# Patient Record
Sex: Male | Born: 1960 | Race: White | Hispanic: No | Marital: Married
Health system: Southern US, Community
[De-identification: ages and names within clinical notes are randomized; demographics above are authoritative.]

## PROBLEM LIST (undated history)

## (undated) DIAGNOSIS — I5032 Chronic diastolic (congestive) heart failure: Secondary | ICD-10-CM

## (undated) DIAGNOSIS — N184 Chronic kidney disease, stage 4 (severe): Secondary | ICD-10-CM

## (undated) DIAGNOSIS — I480 Paroxysmal atrial fibrillation: Secondary | ICD-10-CM

## (undated) DIAGNOSIS — J9621 Acute and chronic respiratory failure with hypoxia: Secondary | ICD-10-CM

## (undated) DIAGNOSIS — J449 Chronic obstructive pulmonary disease, unspecified: Secondary | ICD-10-CM

---

## 2020-04-04 ENCOUNTER — Other Ambulatory Visit (HOSPITAL_COMMUNITY): Payer: Medicare Other

## 2020-04-04 ENCOUNTER — Inpatient Hospital Stay
Admission: AD | Admit: 2020-04-04 | Discharge: 2020-05-17 | Disposition: A | Discharge status: Home Health | Payer: Medicare Other | Source: Other Acute Inpatient Hospital | Attending: Internal Medicine | Admitting: Internal Medicine

## 2020-04-04 DIAGNOSIS — K5649 Other impaction of intestine: Secondary | ICD-10-CM

## 2020-04-04 DIAGNOSIS — T85598A Other mechanical complication of other gastrointestinal prosthetic devices, implants and grafts, initial encounter: Secondary | ICD-10-CM

## 2020-04-04 DIAGNOSIS — J449 Chronic obstructive pulmonary disease, unspecified: Secondary | ICD-10-CM | POA: Diagnosis present

## 2020-04-04 DIAGNOSIS — J9690 Respiratory failure, unspecified, unspecified whether with hypoxia or hypercapnia: Secondary | ICD-10-CM

## 2020-04-04 DIAGNOSIS — J9621 Acute and chronic respiratory failure with hypoxia: Secondary | ICD-10-CM | POA: Diagnosis present

## 2020-04-04 DIAGNOSIS — N184 Chronic kidney disease, stage 4 (severe): Secondary | ICD-10-CM | POA: Diagnosis present

## 2020-04-04 DIAGNOSIS — J189 Pneumonia, unspecified organism: Secondary | ICD-10-CM

## 2020-04-04 DIAGNOSIS — I480 Paroxysmal atrial fibrillation: Secondary | ICD-10-CM | POA: Diagnosis present

## 2020-04-04 DIAGNOSIS — I5032 Chronic diastolic (congestive) heart failure: Secondary | ICD-10-CM | POA: Diagnosis present

## 2020-04-04 HISTORY — DX: Chronic diastolic (congestive) heart failure: I50.32

## 2020-04-04 HISTORY — DX: Acute and chronic respiratory failure with hypoxia: J96.21

## 2020-04-04 HISTORY — DX: Chronic obstructive pulmonary disease, unspecified: J44.9

## 2020-04-04 HISTORY — DX: Paroxysmal atrial fibrillation: I48.0

## 2020-04-04 HISTORY — DX: Chronic kidney disease, stage 4 (severe): N18.4

## 2020-04-04 LAB — CBC WITH DIFFERENTIAL/PLATELET
Abs Immature Granulocytes: 0.31 10*3/uL — ABNORMAL HIGH (ref 0.00–0.07)
Basophils Absolute: 0.2 10*3/uL — ABNORMAL HIGH (ref 0.0–0.1)
Basophils Relative: 1 %
Eosinophils Absolute: 0.5 10*3/uL (ref 0.0–0.5)
Eosinophils Relative: 3 %
HCT: 34.1 % — ABNORMAL LOW (ref 39.0–52.0)
Hemoglobin: 9.2 g/dL — ABNORMAL LOW (ref 13.0–17.0)
Immature Granulocytes: 2 %
Lymphocytes Relative: 9 %
Lymphs Abs: 1.8 10*3/uL (ref 0.7–4.0)
MCH: 30.4 pg (ref 26.0–34.0)
MCHC: 27 g/dL — ABNORMAL LOW (ref 30.0–36.0)
MCV: 112.5 fL — ABNORMAL HIGH (ref 80.0–100.0)
Monocytes Absolute: 1.4 10*3/uL — ABNORMAL HIGH (ref 0.1–1.0)
Monocytes Relative: 7 %
Neutro Abs: 15.6 10*3/uL — ABNORMAL HIGH (ref 1.7–7.7)
Neutrophils Relative %: 78 %
Platelets: 414 10*3/uL — ABNORMAL HIGH (ref 150–400)
RBC: 3.03 MIL/uL — ABNORMAL LOW (ref 4.22–5.81)
RDW: 13.7 % (ref 11.5–15.5)
WBC: 19.7 10*3/uL — ABNORMAL HIGH (ref 4.0–10.5)
nRBC: 0.1 % (ref 0.0–0.2)

## 2020-04-04 LAB — BLOOD GAS, ARTERIAL
Acid-base deficit: 0.2 mmol/L (ref 0.0–2.0)
Bicarbonate: 23.8 mmol/L (ref 20.0–28.0)
FIO2: 30
O2 Saturation: 97.6 %
Patient temperature: 36.7
pCO2 arterial: 37.9 mmHg (ref 32.0–48.0)
pH, Arterial: 7.413 (ref 7.350–7.450)
pO2, Arterial: 93.4 mmHg (ref 83.0–108.0)

## 2020-04-04 LAB — PROTIME-INR
INR: 1.1 (ref 0.8–1.2)
Prothrombin Time: 14 seconds (ref 11.4–15.2)

## 2020-04-04 LAB — APTT: aPTT: 30 seconds (ref 24–36)

## 2020-04-05 ENCOUNTER — Other Ambulatory Visit (HOSPITAL_COMMUNITY): Payer: Medicare Other

## 2020-04-05 DIAGNOSIS — N184 Chronic kidney disease, stage 4 (severe): Secondary | ICD-10-CM | POA: Diagnosis not present

## 2020-04-05 DIAGNOSIS — J449 Chronic obstructive pulmonary disease, unspecified: Secondary | ICD-10-CM

## 2020-04-05 DIAGNOSIS — J9621 Acute and chronic respiratory failure with hypoxia: Secondary | ICD-10-CM

## 2020-04-05 DIAGNOSIS — I480 Paroxysmal atrial fibrillation: Secondary | ICD-10-CM

## 2020-04-05 DIAGNOSIS — I5032 Chronic diastolic (congestive) heart failure: Secondary | ICD-10-CM | POA: Diagnosis not present

## 2020-04-05 LAB — CBC
HCT: 28 % — ABNORMAL LOW (ref 39.0–52.0)
Hemoglobin: 8.6 g/dL — ABNORMAL LOW (ref 13.0–17.0)
MCH: 30.5 pg (ref 26.0–34.0)
MCHC: 30.7 g/dL (ref 30.0–36.0)
MCV: 99.3 fL (ref 80.0–100.0)
Platelets: 411 10*3/uL — ABNORMAL HIGH (ref 150–400)
RBC: 2.82 MIL/uL — ABNORMAL LOW (ref 4.22–5.81)
RDW: 13.7 % (ref 11.5–15.5)
WBC: 19.2 10*3/uL — ABNORMAL HIGH (ref 4.0–10.5)
nRBC: 0 % (ref 0.0–0.2)

## 2020-04-05 LAB — BASIC METABOLIC PANEL
Anion gap: 11 (ref 5–15)
BUN: 109 mg/dL — ABNORMAL HIGH (ref 8–23)
CO2: 23 mmol/L (ref 22–32)
Calcium: 8.7 mg/dL — ABNORMAL LOW (ref 8.9–10.3)
Chloride: 110 mmol/L (ref 98–111)
Creatinine, Ser: 2.79 mg/dL — ABNORMAL HIGH (ref 0.61–1.24)
GFR, Estimated: 25 mL/min — ABNORMAL LOW (ref >60)
Glucose, Bld: 138 mg/dL — ABNORMAL HIGH (ref 70–99)
Potassium: 3.9 mmol/L (ref 3.5–5.1)
Sodium: 144 mmol/L (ref 135–145)

## 2020-04-05 LAB — HEPARIN LEVEL (UNFRACTIONATED)
Heparin Unfractionated: 0.3 IU/mL (ref 0.30–0.70)
Heparin Unfractionated: 0.37 IU/mL (ref 0.30–0.70)

## 2020-04-05 NOTE — Consult Note (Signed)
Pulmonary [name]  PULMONARY SERVICE  Date of Service: 04/05/2020  PULMONARY CRITICAL CARE Antonio Chavez  [id number]  DOB: 04/08/1960   DOA: 04/04/2020  Referring Physician: Merton Border, MD  HPI: Antonio Chavez is a 60 y.o. male seen for follow up of Acute on Chronic Respiratory Failure.  Patient is transferred to [place] for further management had a rather complicated course with development of acute respiratory failure atrial fibrillation chronic kidney disease nonsustained ventricular tachycardia and severe COPD at the other facility.  The patient presented with increasing shortness of breath going on for approximately 3 days prior to admission.  At that time patient was noted to have increased swelling and been on Lasix as well as spironolactone and did not have significant improvement.  Patient also does have history of sleep apnea and has been using the CPAP nightly.  Patient in the emergency department had an elevated white count and so therefore was started on antibiotics.  Hospital course was admitted to the ICU on BiPAP with renal failure acutely on chronic.  The patient ended up having decompensation eventually intubated placed on the ventilator.  Subsequently patient was not able to wean off of the ventilator.  Patient is now transferred to our facility for further management and weaning.  Review of Systems:  ROS performed and is unremarkable other than noted above.  Allergies  Allergen Reactions  . Codeine Shortness Of Breath  "SHUTS DOWN MY LUNGS"  . Morphine Shortness Of Breath  "SHUTS DOWN MY LUNGS"  . Unknown [Other] Anaphylaxis  Heavy narcotics  . Bactrim [Sulfamethoxazole-Trimethoprim] Rash   Home Meds (Not in a hospital admission)  Scheduled Meds  Continuous Infusions:   MEDICAL, SURGICAL, FAMILY AND SOCIAL HISTORY:   Past Medical History:  Diagnosis Date  . Arthritis  . Congestive heart failure (*)  .  Elevated troponin 12/27/2019  . Fibromyalgia  . Hypertension  . Severe sepsis with septic shock (*) 12/27/2019  . Sleep apnea  CO2 retention  . Tachycardia 12/27/2019   Past Surgical History:  Procedure Laterality Date  . Cholecystectomy 2000  . Gastric fundoplication [id number]  . Hernia repair 2013  . Small intestine surgery 6 years ago   Family History  Problem Relation Age of Onset  . Arthritis Sister  . Hypertension Sister  . Hypertension Son  . Migraines Maternal Grandmother   Social History   Socioeconomic History  . Marital status: Married  Spouse name: None  . Number of children: None  . Years of education: None  . Highest education level: None  Occupational History  Comment: DISABLED  Tobacco Use  . Smoking status: Former Smoker  Packs/day: 0.50  Years: 1.00  Pack years: 0.50  Start date: 22  Quit date: 1978  Years since quitting: 44.2  . Smokeless tobacco: Former Systems developer  . Tobacco comment: smoked 6 months  Vaping Use  . Vaping Use: Never used     Medications: Reviewed on Rounds  Physical Exam:  Vitals: Temperature is 99.8 pulse 69 respiratory rate 21 blood pressure is 160/83 saturations 97%  Ventilator Settings assist control FiO2 40% tidal volume 550 PEEP 5  . General: Comfortable at this time . Eyes: Grossly normal lids, irises & conjunctiva . ENT: grossly tongue is normal . Neck: no obvious mass . Cardiovascular: S1-S2 normal no gallop or rub . Respiratory: Scattered rhonchi . Abdomen: Soft nontender . Skin: no rash seen on limited exam . Musculoskeletal: not rigid . Psychiatric:unable to  assess . Neurologic: no seizure no involuntary movements         Labs on Admission:  Basic Metabolic Panel: Recent Labs  Lab 04/05/20 0159  NA 144  K 3.9  CL 110  CO2 23  GLUCOSE 138*  BUN 109*  CREATININE 2.79*  CALCIUM 8.7*    Recent Labs  Lab 04/04/20 1830  PHART 7.413  PCO2ART 37.9  PO2ART 93.4  HCO3 23.8  O2SAT 97.6    Liver  Function Tests: No results for input(s): AST, ALT, ALKPHOS, BILITOT, PROT, ALBUMIN in the last 168 hours. No results for input(s): LIPASE, AMYLASE in the last 168 hours. No results for input(s): AMMONIA in the last 168 hours.  CBC: Recent Labs  Lab 04/04/20 2004 04/05/20 0159  WBC 19.7* 19.2*  NEUTROABS 15.6*  --   HGB 9.2* 8.6*  HCT 34.1* 28.0*  MCV 112.5* 99.3  PLT 414* 411*    Cardiac Enzymes: No results for input(s): CKTOTAL, CKMB, CKMBINDEX, TROPONINI in the last 168 hours.  BNP (last 3 results) No results for input(s): BNP in the last 8760 hours.  ProBNP (last 3 results) No results for input(s): PROBNP in the last 8760 hours.   Radiological Exams on Admission: DG Abd Portable 1V  Result Date: 04/04/2020 CLINICAL DATA:  Check gastric catheter placement EXAM: PORTABLE ABDOMEN - 1 VIEW COMPARISON:  None. FINDINGS: Gastric catheter is noted within the stomach. No obstructive changes are seen. No free air is noted. IMPRESSION: Gastric catheter within the stomach. Electronically Signed   By: Inez Catalina M.D.   On: 04/04/2020 21:54    Assessment/Plan Active Problems:   Acute on chronic respiratory failure with hypoxia (HCC)   Chronic kidney disease, stage IV (severe) (HCC)   Paroxysmal atrial fibrillation (HCC)   COPD, severe (HCC)   Chronic heart failure with preserved ejection fraction ([place])   1. Acute on chronic respiratory failure hypoxia plan is going to be to try weaning again on pressure support patient was apparently able to complete 3 hours of pressure support without any issues. 2. Chronic kidney disease stage IV patient will need nephrology follow-up. 3. Paroxysmal atrial fibrillation rate now rate is controlled we will continue with supportive care. 4. COPD unspecified nebulizers as necessary we will continue with supportive care. 5. Chronic heart failure preserved ejection fraction we will need careful fluid management diuresis as tolerated.  With  supportive care.  I have personally seen and evaluated the patient, evaluated laboratory and imaging results, formulated the assessment and plan and placed orders. The Patient requires high complexity decision making with multiple systems involvement.  Case was discussed on Rounds with the Respiratory Therapy Director and the Respiratory staff Time Spent 72mnutes  Alexzia Kasler A Armas Mcbee, MD FAthens Digestive Endoscopy CenterPulmonary Critical Care Medicine Sleep Medicine

## 2020-04-06 ENCOUNTER — Encounter: Payer: Self-pay | Admitting: Internal Medicine

## 2020-04-06 DIAGNOSIS — J449 Chronic obstructive pulmonary disease, unspecified: Secondary | ICD-10-CM | POA: Diagnosis present

## 2020-04-06 DIAGNOSIS — N184 Chronic kidney disease, stage 4 (severe): Secondary | ICD-10-CM | POA: Diagnosis not present

## 2020-04-06 DIAGNOSIS — I5032 Chronic diastolic (congestive) heart failure: Secondary | ICD-10-CM | POA: Diagnosis present

## 2020-04-06 DIAGNOSIS — J9621 Acute and chronic respiratory failure with hypoxia: Secondary | ICD-10-CM | POA: Diagnosis not present

## 2020-04-06 DIAGNOSIS — I480 Paroxysmal atrial fibrillation: Secondary | ICD-10-CM | POA: Diagnosis present

## 2020-04-06 NOTE — Progress Notes (Signed)
Pulmonary Critical Care Medicine [place]  PROGRESS NOTE     Antonio Chavez  [id number]  DOB: Feb 06, 1960   DOA: 04/04/2020  Referring Physician: Merton Border, MD  HPI: Antonio Chavez is a 60 y.o. male seen for follow up of Acute on Chronic Respiratory Failure.  Patient is comfortable right now without distress still on full vent support.  Has been on 40% FiO2  Medications: Reviewed on Rounds  Physical Exam:  Vitals: Temperature is 98.6 pulse 69 respiratory 18 blood pressure is 167/92 saturations 100%  Ventilator Settings on assist control FiO2 is 40% tidal volume 550 PEEP 5  . General: Comfortable at this time . Eyes: Grossly normal lids, irises & conjunctiva . ENT: grossly tongue is normal . Neck: no obvious mass . Cardiovascular: S1 S2 normal no gallop . Respiratory: Scattered rhonchi expansion is equal at this time . Abdomen: soft . Skin: no rash seen on limited exam . Musculoskeletal: not rigid . Psychiatric:unable to assess . Neurologic: no seizure no involuntary movements         Lab Data:   Basic Metabolic Panel: Recent Labs  Lab 04/05/20 0159  NA 144  K 3.9  CL 110  CO2 23  GLUCOSE 138*  BUN 109*  CREATININE 2.79*  CALCIUM 8.7*    ABG: Recent Labs  Lab 04/04/20 1830  PHART 7.413  PCO2ART 37.9  PO2ART 93.4  HCO3 23.8  O2SAT 97.6    Liver Function Tests: No results for input(s): AST, ALT, ALKPHOS, BILITOT, PROT, ALBUMIN in the last 168 hours. No results for input(s): LIPASE, AMYLASE in the last 168 hours. No results for input(s): AMMONIA in the last 168 hours.  CBC: Recent Labs  Lab 04/04/20 2004 04/05/20 0159  WBC 19.7* 19.2*  NEUTROABS 15.6*  --   HGB 9.2* 8.6*  HCT 34.1* 28.0*  MCV 112.5* 99.3  PLT 414* 411*    Cardiac Enzymes: No results for input(s): CKTOTAL, CKMB, CKMBINDEX, TROPONINI in the last 168 hours.  BNP (last 3 results) No results for input(s): BNP in the  last 8760 hours.  ProBNP (last 3 results) No results for input(s): PROBNP in the last 8760 hours.  Radiological Exams: DG CHEST PORT 1 VIEW  Result Date: 04/05/2020 CLINICAL DATA:  Pneumonia. EXAM: PORTABLE CHEST 1 VIEW COMPARISON:  No prior. FINDINGS: Tracheostomy tube, NG tube, left IJ line noted good anatomic position. Tubing noted over the right chest. Heart size normal. Low lung volumes. Bilateral pleural-parenchymal thickening most consistent scarring. No focal alveolar infiltrate. No prominent pleural effusion or pneumothorax. Postsurgical changes upper abdomen. IMPRESSION: 1.  Lines and tubes in good anatomic position. 2. Low lung volumes. Bilateral pleural-parenchymal thickening consistent scarring. Electronically Signed   By: Marcello Moores  Register   On: 04/05/2020 13:21   DG Abd Portable 1V  Result Date: 04/04/2020 CLINICAL DATA:  Check gastric catheter placement EXAM: PORTABLE ABDOMEN - 1 VIEW COMPARISON:  None. FINDINGS: Gastric catheter is noted within the stomach. No obstructive changes are seen. No free air is noted. IMPRESSION: Gastric catheter within the stomach. Electronically Signed   By: Inez Catalina M.D.   On: 04/04/2020 21:54    Assessment/Plan Active Problems:   Acute on chronic respiratory failure with hypoxia (HCC)   Chronic kidney disease, stage IV (severe) (HCC)   Paroxysmal atrial fibrillation (HCC)   COPD, severe (HCC)   Chronic heart failure with preserved ejection fraction ([place])   1. Acute on chronic respiratory failure hypoxia plan  is going to be to continue with full support on assist control mode patient is on 40% FiO2. 2. Chronic kidney disease stage IV continue supportive care 3. Paroxysmal atrial fibrillation rate is controlled 4. Chronic heart failure preserved ejection fraction at baseline   I have personally seen and evaluated the patient, evaluated laboratory and imaging results, formulated the assessment and plan and placed orders. The Patient  requires high complexity decision making with multiple systems involvement.  Rounds were done with the Respiratory Therapy Director and Staff therapists and discussed with nursing staff also.  Allyne Gee, MD [facility] Pulmonary Critical Care Medicine Sleep Medicine

## 2020-04-07 LAB — VANCOMYCIN, TROUGH: Vancomycin Tr: 24 ug/mL (ref 15–20)

## 2020-04-07 LAB — CULTURE, RESPIRATORY W GRAM STAIN

## 2020-04-07 NOTE — Consult Note (Signed)
[facility] CONSULT NOTE    Date: 04/07/2020                  Patient Name:  Antonio Chavez  MRN: [id number]  DOB: 04/16/1960  Age / Sex: 60 y.o., male         PCP: Pcp, No                 Service Requesting Consult:  Hospitalist                 Reason for Consult:  Chronic kidney disease stage IV            History of Present Illness: Patient is a 60 y.o. male with a PMHx of acute on chronic respiratory failure status post tracheostomy placement, paroxysmal atrial fibrillation, acute diastolic heart failure, chronic kidney disease stage IV, nonsustained ventricular tachycardia, obstructive sleep apnea, COPD, hypertension, history of status epilepticus, who was admitted to [facility] on 04/04/2020 for ongoing care.  He presented to outside hospital originally with shortness of breath.  He came in with diastolic heart failure exacerbation.  He initially required BiPAP but then progressed to the ventilator.  Patient status post tracheostomy on 04/03/2020.  He was receiving tube feeds through NG tube.  We are asked to see him for evaluation management of acute kidney injury and chronic kidney disease stage IV.  When he discharged from the outside hospital his creatinine was 2.45 with a BUN of 101 and EGFR 29.  Currently his BUN is 109 with a creatinine of 2.79 and EGFR of 25.  He is awake and alert and will follow commands.   Medications: Cefepime 2 g IV twice daily, Humalog sliding scale, amantadine 100 mg daily, amlodipine 5 mg daily, B complex 1 capsule daily, Brovana 15 mcg inhaled twice daily, budesonide 0.5 mg inhaled twice daily, Calcitrol 0.25 mcg daily, calcium carbonate 1000 mg 3 times daily, Eliquis 5 mg twice daily, Pepcid 20 mg daily, ferrous sulfate 300 mg daily, fish oil 4 g daily, hydralazine 25 mg 3 times daily, Keppra 750 mg twice daily, loratadine 10 mg daily, melatonin 3 mg nightly, renal vitamin 1 tablet daily, phenytoin 200 mg twice daily,  sertraline, vancomycin 1250 mg IV daily   Allergies: Codeine, morphine, Bactrim   Past Medical History: Past Medical History:  Diagnosis Date  . Acute on chronic respiratory failure with hypoxia ([place])   . Chronic heart failure with preserved ejection fraction ([place])   . Chronic kidney disease, stage IV (severe) ([place])   . COPD, severe ([place])   . Paroxysmal atrial fibrillation (HCC)   Nonsustained ventricular tachycardia Obstructive sleep apnea History of status epilepticus Hypertension    Past Surgical History: Status post tracheostomy placement  Family History: Unable to obtain from the patient as he is currently on the ventilator  Social History: Unable to obtain from the patient as he is currently on the ventilator  Review of Systems: Unable to obtain from the patient as he is currently on the ventilator  Vital Signs: Temperature 97.6 pulse 49 respirations 16 blood pressure 108/73  Weight trends: There were no vitals filed for this visit.   Physical Exam: General:  Critically ill-appearing  Head:  Normocephalic, atraumatic. Moist oral mucosal membranes  Eyes:  Anicteric  Neck:  Tracheostomy in place  Lungs:   Scattered rhonchi, vent assisted  Heart:  S1S2 no rubs  Abdomen:   Soft, nontender, bowel sounds present  Extremities:  1+ peripheral edema.  Neurologic:  Awake, alert, following commands  Skin:  Bilateral upper extremity ecchymoses  Access:  No dialysis access    Lab results: Basic Metabolic Panel: Recent Labs  Lab 04/05/20 0159  NA 144  K 3.9  CL 110  CO2 23  GLUCOSE 138*  BUN 109*  CREATININE 2.79*  CALCIUM 8.7*    Liver Function Tests: No results for input(s): AST, ALT, ALKPHOS, BILITOT, PROT, ALBUMIN in the last 168 hours. No results for input(s): LIPASE, AMYLASE in the last 168 hours. No results for input(s): AMMONIA in the last 168 hours.  CBC: Recent Labs  Lab 04/04/20 2004 04/05/20 0159  WBC 19.7* 19.2*  NEUTROABS 15.6*   --   HGB 9.2* 8.6*  HCT 34.1* 28.0*  MCV 112.5* 99.3  PLT 414* 411*    Cardiac Enzymes: No results for input(s): CKTOTAL, CKMB, CKMBINDEX, TROPONINI in the last 168 hours.  BNP: Invalid input(s): POCBNP  CBG: No results for input(s): GLUCAP in the last 168 hours.  Microbiology: Results for orders placed or performed during the hospital encounter of 04/04/20  Culture, Respiratory w Gram Stain     Status: None (Preliminary result)   Collection Time: 04/05/20 12:23 PM   Specimen: Tracheal Aspirate; Respiratory  Result Value Ref Range Status   Specimen Description TRACHEAL ASPIRATE  Final   Special Requests NONE  Final   Gram Stain   Final    FEW WBC PRESENT, PREDOMINANTLY PMN ABUNDANT GRAM NEGATIVE RODS FEW GRAM POSITIVE COCCI IN PAIRS IN CLUSTERS    Culture   Final    ABUNDANT PSEUDOMONAS AERUGINOSA SUSCEPTIBILITIES TO FOLLOW Performed at [facility], [place] [address] [place], [place] [address]    Report Status PENDING  Incomplete  Culture, blood (routine x 2)     Status: None (Preliminary result)   Collection Time: 04/05/20 12:41 PM   Specimen: BLOOD LEFT HAND  Result Value Ref Range Status   Specimen Description BLOOD LEFT HAND  Final   Special Requests   Final    BOTTLES DRAWN AEROBIC AND ANAEROBIC Blood Culture adequate volume   Culture   Final    NO GROWTH < 24 HOURS Performed at [facility], [place] [address], [place], [place] [address]    Report Status PENDING  Incomplete  Culture, blood (routine x 2)     Status: None (Preliminary result)   Collection Time: 04/05/20 12:50 PM   Specimen: BLOOD RIGHT HAND  Result Value Ref Range Status   Specimen Description BLOOD RIGHT HAND  Final   Special Requests   Final    BOTTLES DRAWN AEROBIC AND ANAEROBIC Blood Culture results may not be optimal due to an inadequate volume of blood received in culture bottles   Culture   Final    NO GROWTH < 24 HOURS Performed at [facility], [place] [address]  [place], [place] [address]    Report Status PENDING  Incomplete    Coagulation Studies: Recent Labs    04/04/20 2002/03/26  LABPROT 14.0  INR 1.1    Urinalysis: No results for input(s): COLORURINE, LABSPEC, PHURINE, GLUCOSEU, HGBUR, BILIRUBINUR, KETONESUR, PROTEINUR, UROBILINOGEN, NITRITE, LEUKOCYTESUR in the last 72 hours.  Invalid input(s): APPERANCEUR    Imaging: DG CHEST PORT 1 VIEW  Result Date: 04/05/2020 CLINICAL DATA:  Pneumonia. EXAM: PORTABLE CHEST 1 VIEW COMPARISON:  No prior. FINDINGS: Tracheostomy tube, NG tube, left IJ line noted good anatomic position. Tubing noted over the right chest. Heart size normal. Low lung volumes.  Bilateral pleural-parenchymal thickening most consistent scarring. No focal alveolar infiltrate. No prominent pleural effusion or pneumothorax. Postsurgical changes upper abdomen. IMPRESSION: 1.  Lines and tubes in good anatomic position. 2. Low lung volumes. Bilateral pleural-parenchymal thickening consistent scarring. Electronically Signed   By: Marcello Moores  Register   On: 04/05/2020 13:21      Assessment & Plan: Pt is a 60 y.o. male with a PMHx of acute on chronic respiratory failure status post tracheostomy placement, paroxysmal atrial fibrillation, acute diastolic heart failure, chronic kidney disease stage IV, nonsustained ventricular tachycardia, obstructive sleep apnea, COPD, hypertension, history of status epilepticus, who was admitted to [facility] on 04/04/2020 for ongoing care.  1.  Acute kidney injury/chronic kidney disease stage IV.  When the patient left outside hospital his EGFR was 49.  Currently EGFR 25 with a BUN of 109 and creatinine of 2.79.  Patient currently on vancomycin at 1250 mg IV daily.  Check serum vancomycin levels closely to make sure he does not develop toxicity as he has significant renal dysfunction.  No indication for dialysis as he made 2.4 L of urine yesterday.  Continue to periodically monitor renal parameters and avoid  nephrotoxins as possible.  2.  Acute respiratory failure.  Patient maintained on ventilatory support.  Weaning as per pulmonary/critical care.  3.  Anemia of chronic kidney disease.  Hemoglobin 8.6.  Start the patient on Retacrit 20,000 units subcutaneous weekly.  4.  Thanks for consultation.

## 2020-04-08 LAB — CBC
HCT: 29.5 % — ABNORMAL LOW (ref 39.0–52.0)
Hemoglobin: 9 g/dL — ABNORMAL LOW (ref 13.0–17.0)
MCH: 31.7 pg (ref 26.0–34.0)
MCHC: 30.5 g/dL (ref 30.0–36.0)
MCV: 103.9 fL — ABNORMAL HIGH (ref 80.0–100.0)
Platelets: 375 10*3/uL (ref 150–400)
RBC: 2.84 MIL/uL — ABNORMAL LOW (ref 4.22–5.81)
RDW: 14.2 % (ref 11.5–15.5)
WBC: 15.4 10*3/uL — ABNORMAL HIGH (ref 4.0–10.5)
nRBC: 0 % (ref 0.0–0.2)

## 2020-04-08 LAB — BASIC METABOLIC PANEL
Anion gap: 11 (ref 5–15)
BUN: 136 mg/dL — ABNORMAL HIGH (ref 8–23)
CO2: 22 mmol/L (ref 22–32)
Calcium: 11.2 mg/dL — ABNORMAL HIGH (ref 8.9–10.3)
Chloride: 111 mmol/L (ref 98–111)
Creatinine, Ser: 2.73 mg/dL — ABNORMAL HIGH (ref 0.61–1.24)
GFR, Estimated: 26 mL/min — ABNORMAL LOW (ref >60)
Glucose, Bld: 141 mg/dL — ABNORMAL HIGH (ref 70–99)
Potassium: 3.7 mmol/L (ref 3.5–5.1)
Sodium: 144 mmol/L (ref 135–145)

## 2020-04-08 LAB — VANCOMYCIN, TROUGH: Vancomycin Tr: 20 ug/mL (ref 15–20)

## 2020-04-11 LAB — CULTURE, BLOOD (ROUTINE X 2)
Culture: NO GROWTH
Culture: NO GROWTH
Special Requests: ADEQUATE

## 2020-04-11 LAB — BASIC METABOLIC PANEL
Anion gap: 10 (ref 5–15)
BUN: 138 mg/dL — ABNORMAL HIGH (ref 8–23)
CO2: 26 mmol/L (ref 22–32)
Calcium: 11.7 mg/dL — ABNORMAL HIGH (ref 8.9–10.3)
Chloride: 104 mmol/L (ref 98–111)
Creatinine, Ser: 2.73 mg/dL — ABNORMAL HIGH (ref 0.61–1.24)
GFR, Estimated: 26 mL/min — ABNORMAL LOW (ref >60)
Glucose, Bld: 134 mg/dL — ABNORMAL HIGH (ref 70–99)
Potassium: 3.4 mmol/L — ABNORMAL LOW (ref 3.5–5.1)
Sodium: 140 mmol/L (ref 135–145)

## 2020-04-11 LAB — CBC
HCT: 32.9 % — ABNORMAL LOW (ref 39.0–52.0)
Hemoglobin: 10 g/dL — ABNORMAL LOW (ref 13.0–17.0)
MCH: 30.9 pg (ref 26.0–34.0)
MCHC: 30.4 g/dL (ref 30.0–36.0)
MCV: 101.5 fL — ABNORMAL HIGH (ref 80.0–100.0)
Platelets: 400 10*3/uL (ref 150–400)
RBC: 3.24 MIL/uL — ABNORMAL LOW (ref 4.22–5.81)
RDW: 14.6 % (ref 11.5–15.5)
WBC: 16.4 10*3/uL — ABNORMAL HIGH (ref 4.0–10.5)
nRBC: 0.1 % (ref 0.0–0.2)

## 2020-04-11 LAB — MAGNESIUM: Magnesium: 2.8 mg/dL — ABNORMAL HIGH (ref 1.7–2.4)

## 2020-04-13 LAB — BASIC METABOLIC PANEL
Anion gap: 10 (ref 5–15)
BUN: 116 mg/dL — ABNORMAL HIGH (ref 8–23)
CO2: 23 mmol/L (ref 22–32)
Calcium: 9.6 mg/dL (ref 8.9–10.3)
Chloride: 105 mmol/L (ref 98–111)
Creatinine, Ser: 2.47 mg/dL — ABNORMAL HIGH (ref 0.61–1.24)
GFR, Estimated: 29 mL/min — ABNORMAL LOW (ref >60)
Glucose, Bld: 151 mg/dL — ABNORMAL HIGH (ref 70–99)
Potassium: 3.5 mmol/L (ref 3.5–5.1)
Sodium: 138 mmol/L (ref 135–145)

## 2020-04-13 LAB — CBC
HCT: 30.2 % — ABNORMAL LOW (ref 39.0–52.0)
Hemoglobin: 9.4 g/dL — ABNORMAL LOW (ref 13.0–17.0)
MCH: 30.9 pg (ref 26.0–34.0)
MCHC: 31.1 g/dL (ref 30.0–36.0)
MCV: 99.3 fL (ref 80.0–100.0)
Platelets: 328 10*3/uL (ref 150–400)
RBC: 3.04 MIL/uL — ABNORMAL LOW (ref 4.22–5.81)
RDW: 14.9 % (ref 11.5–15.5)
WBC: 13.2 10*3/uL — ABNORMAL HIGH (ref 4.0–10.5)
nRBC: 0 % (ref 0.0–0.2)

## 2020-04-14 ENCOUNTER — Other Ambulatory Visit (HOSPITAL_COMMUNITY): Payer: Medicare Other

## 2020-04-14 LAB — BLOOD GAS, ARTERIAL
Acid-base deficit: 1 mmol/L (ref 0.0–2.0)
Bicarbonate: 23.6 mmol/L (ref 20.0–28.0)
FIO2: 28
O2 Saturation: 96 %
Patient temperature: 37
pCO2 arterial: 41.9 mmHg (ref 32.0–48.0)
pH, Arterial: 7.368 (ref 7.350–7.450)
pO2, Arterial: 80.5 mmHg — ABNORMAL LOW (ref 83.0–108.0)

## 2020-04-15 ENCOUNTER — Other Ambulatory Visit (HOSPITAL_COMMUNITY): Payer: Medicare Other

## 2020-04-15 LAB — LEVETIRACETAM LEVEL: Levetiracetam Lvl: 33.1 ug/mL (ref 10.0–40.0)

## 2020-04-17 DIAGNOSIS — I5032 Chronic diastolic (congestive) heart failure: Secondary | ICD-10-CM | POA: Diagnosis not present

## 2020-04-17 DIAGNOSIS — J449 Chronic obstructive pulmonary disease, unspecified: Secondary | ICD-10-CM | POA: Diagnosis not present

## 2020-04-17 DIAGNOSIS — J9621 Acute and chronic respiratory failure with hypoxia: Secondary | ICD-10-CM | POA: Diagnosis not present

## 2020-04-17 DIAGNOSIS — N184 Chronic kidney disease, stage 4 (severe): Secondary | ICD-10-CM | POA: Diagnosis not present

## 2020-04-17 NOTE — Progress Notes (Signed)
Pulmonary Critical Care Medicine [place]  PROGRESS NOTE     Antonio Chavez  [id number]  DOB: 11-15-1960   DOA: 04/04/2020  Referring Physician: Merton Border, MD  HPI: Antonio Chavez is a 60 y.o. male seen for follow up of Acute on Chronic Respiratory Failure.  Currently is on T collar on 20% FiO2 should be able to start capping  Medications: Reviewed on Rounds  Physical Exam:  Vitals: Temperature is 98.2 pulse 109 respiratory rate is 39 blood pressure 172/65 saturations 96%  Ventilator Settings off the ventilator on T collar FiO2 28%  . General: Comfortable at this time . Eyes: Grossly normal lids, irises & conjunctiva . ENT: grossly tongue is normal . Neck: no obvious mass . Cardiovascular: S1 S2 normal no gallop . Respiratory: No rhonchi very coarse percent . Abdomen: soft . Skin: no rash seen on limited exam . Musculoskeletal: not rigid . Psychiatric:unable to assess . Neurologic: no seizure no involuntary movements         Lab Data:   Basic Metabolic Panel: Recent Labs  Lab 04/11/20 1200 04/13/20 0534  NA 140 138  K 3.4* 3.5  CL 104 105  CO2 26 23  GLUCOSE 134* 151*  BUN 138* 116*  CREATININE 2.73* 2.47*  CALCIUM 11.7* 9.6  MG 2.8*  --     ABG: Recent Labs  Lab 04/14/20 0920  PHART 7.368  PCO2ART 41.9  PO2ART 80.5*  HCO3 23.6  O2SAT 96.0    Liver Function Tests: No results for input(s): AST, ALT, ALKPHOS, BILITOT, PROT, ALBUMIN in the last 168 hours. No results for input(s): LIPASE, AMYLASE in the last 168 hours. No results for input(s): AMMONIA in the last 168 hours.  CBC: Recent Labs  Lab 04/11/20 1200 04/13/20 0534  WBC 16.4* 13.2*  HGB 10.0* 9.4*  HCT 32.9* 30.2*  MCV 101.5* 99.3  PLT 400 328    Cardiac Enzymes: No results for input(s): CKTOTAL, CKMB, CKMBINDEX, TROPONINI in the last 168 hours.  BNP (last 3 results) No results for input(s): BNP in the last 8760  hours.  ProBNP (last 3 results) No results for input(s): PROBNP in the last 8760 hours.  Radiological Exams: No results found.  Assessment/Plan Active Problems:   Acute on chronic respiratory failure with hypoxia (HCC)   Chronic kidney disease, stage IV (severe) (HCC)   Paroxysmal atrial fibrillation (HCC)   COPD, severe (HCC)   Chronic heart failure with preserved ejection fraction ([place])   1. Acute on chronic respiratory failure hypoxia we will continue with T-piece titrate oxygen as tolerated continue pulmonary toilet 2. Chronic kidney disease stage IV no change continue supportive care 3. Paroxysmal atrial fibrillation rate controlled 4. Severe COPD medical management 5. Chronic heart failure preserved ejection fraction at baseline   I have personally seen and evaluated the patient, evaluated laboratory and imaging results, formulated the assessment and plan and placed orders. The Patient requires high complexity decision making with multiple systems involvement.  Rounds were done with the Respiratory Therapy Director and Staff therapists and discussed with nursing staff also.  Allyne Gee, MD [facility] Pulmonary Critical Care Medicine Sleep Medicine

## 2020-04-18 ENCOUNTER — Other Ambulatory Visit (HOSPITAL_COMMUNITY): Payer: Medicare Other

## 2020-04-18 DIAGNOSIS — J9621 Acute and chronic respiratory failure with hypoxia: Secondary | ICD-10-CM | POA: Diagnosis not present

## 2020-04-18 DIAGNOSIS — I5032 Chronic diastolic (congestive) heart failure: Secondary | ICD-10-CM | POA: Diagnosis not present

## 2020-04-18 DIAGNOSIS — N184 Chronic kidney disease, stage 4 (severe): Secondary | ICD-10-CM | POA: Diagnosis not present

## 2020-04-18 DIAGNOSIS — J449 Chronic obstructive pulmonary disease, unspecified: Secondary | ICD-10-CM | POA: Diagnosis not present

## 2020-04-18 LAB — URINALYSIS, ROUTINE W REFLEX MICROSCOPIC
Bilirubin Urine: NEGATIVE
Glucose, UA: NEGATIVE mg/dL
Hgb urine dipstick: NEGATIVE
Ketones, ur: NEGATIVE mg/dL
Leukocytes,Ua: NEGATIVE
Nitrite: NEGATIVE
Protein, ur: 100 mg/dL — AB
Specific Gravity, Urine: 1.016 (ref 1.005–1.030)
pH: 5 (ref 5.0–8.0)

## 2020-04-18 LAB — BASIC METABOLIC PANEL
Anion gap: 12 (ref 5–15)
BUN: 164 mg/dL — ABNORMAL HIGH (ref 8–23)
CO2: 22 mmol/L (ref 22–32)
Calcium: 9.6 mg/dL (ref 8.9–10.3)
Chloride: 110 mmol/L (ref 98–111)
Creatinine, Ser: 2.98 mg/dL — ABNORMAL HIGH (ref 0.61–1.24)
GFR, Estimated: 23 mL/min — ABNORMAL LOW (ref >60)
Glucose, Bld: 162 mg/dL — ABNORMAL HIGH (ref 70–99)
Potassium: 3.9 mmol/L (ref 3.5–5.1)
Sodium: 144 mmol/L (ref 135–145)

## 2020-04-18 LAB — BLOOD GAS, ARTERIAL
Acid-base deficit: 5.5 mmol/L — ABNORMAL HIGH (ref 0.0–2.0)
Acid-base deficit: 19.1 mmol/L — ABNORMAL HIGH (ref 0.0–2.0)
Bicarbonate: 22.7 mmol/L (ref 20.0–28.0)
Bicarbonate: 19.1 mmol/L — ABNORMAL LOW (ref 20.0–28.0)
FIO2: 98
FIO2: 28
O2 Saturation: 87.3 %
O2 Saturation: 97.1 %
Patient temperature: 36.7
Patient temperature: 37
pCO2 arterial: 71.8 mmHg (ref 32.0–48.0)
pCO2 arterial: 46.3 mmHg (ref 32.0–48.0)
pH, Arterial: 7.124 — CL (ref 7.350–7.450)
pH, Arterial: 7.256 — ABNORMAL LOW (ref 7.350–7.450)
pO2, Arterial: 58.9 mmHg — ABNORMAL LOW (ref 83.0–108.0)
pO2, Arterial: 85.5 mmHg (ref 83.0–108.0)

## 2020-04-18 LAB — CBC
HCT: 37.8 % — ABNORMAL LOW (ref 39.0–52.0)
Hemoglobin: 11.2 g/dL — ABNORMAL LOW (ref 13.0–17.0)
MCH: 30.9 pg (ref 26.0–34.0)
MCHC: 29.6 g/dL — ABNORMAL LOW (ref 30.0–36.0)
MCV: 104.4 fL — ABNORMAL HIGH (ref 80.0–100.0)
Platelets: 322 10*3/uL (ref 150–400)
RBC: 3.62 MIL/uL — ABNORMAL LOW (ref 4.22–5.81)
RDW: 15.9 % — ABNORMAL HIGH (ref 11.5–15.5)
WBC: 23.6 10*3/uL — ABNORMAL HIGH (ref 4.0–10.5)
nRBC: 0.1 % (ref 0.0–0.2)

## 2020-04-18 NOTE — Progress Notes (Signed)
Pulmonary Critical Care Medicine [place]  PROGRESS NOTE     Ranny Hertenstein  [id number]  DOB: Feb 11, 1960   DOA: 04/04/2020  Referring Physician: Merton Border, MD  HPI: Zaccai Drudge is a 60 y.o. male seen for follow up of Acute on Chronic Respiratory Failure.  Patient is doing well with capping his been on 2 L of oxygen  Medications: Reviewed on Rounds  Physical Exam:  Vitals: Temperature is 97.8 pulse 85 respiratory rate is 23 blood pressure 170/85 saturations 97%  Ventilator Settings capping off the ventilator  . General: Comfortable at this time . Eyes: Grossly normal lids, irises & conjunctiva . ENT: grossly tongue is normal . Neck: no obvious mass . Cardiovascular: S1 S2 normal no gallop . Respiratory: No rhonchi no rales are noted at this time . Abdomen: soft . Skin: no rash seen on limited exam . Musculoskeletal: not rigid . Psychiatric:unable to assess . Neurologic: no seizure no involuntary movements         Lab Data:   Basic Metabolic Panel: Recent Labs  Lab 04/11/20 1200 04/13/20 0534 04/18/20 0618  NA 140 138 144  K 3.4* 3.5 3.9  CL 104 105 110  CO2 '26 23 22  '$ GLUCOSE 134* 151* 162*  BUN 138* 116* 164*  CREATININE 2.73* 2.47* 2.98*  CALCIUM 11.7* 9.6 9.6  MG 2.8*  --   --     ABG: Recent Labs  Lab 04/14/20 0920  PHART 7.368  PCO2ART 41.9  PO2ART 80.5*  HCO3 23.6  O2SAT 96.0    Liver Function Tests: No results for input(s): AST, ALT, ALKPHOS, BILITOT, PROT, ALBUMIN in the last 168 hours. No results for input(s): LIPASE, AMYLASE in the last 168 hours. No results for input(s): AMMONIA in the last 168 hours.  CBC: Recent Labs  Lab 04/11/20 1200 04/13/20 0534 04/18/20 0618  WBC 16.4* 13.2* 23.6*  HGB 10.0* 9.4* 11.2*  HCT 32.9* 30.2* 37.8*  MCV 101.5* 99.3 104.4*  PLT 400 328 322    Cardiac Enzymes: No results for input(s): CKTOTAL, CKMB, CKMBINDEX, TROPONINI in the  last 168 hours.  BNP (last 3 results) No results for input(s): BNP in the last 8760 hours.  ProBNP (last 3 results) No results for input(s): PROBNP in the last 8760 hours.  Radiological Exams: No results found.  Assessment/Plan Active Problems:   Acute on chronic respiratory failure with hypoxia (HCC)   Chronic kidney disease, stage IV (severe) (HCC)   Paroxysmal atrial fibrillation (HCC)   COPD, severe (HCC)   Chronic heart failure with preserved ejection fraction ([place])   1. Acute on chronic respiratory failure hypoxia we will continue with the capping trials. 2. Chronic kidney disease stage IV patient is at baseline 3. Paroxysmal atrial fibrillation rate is controlled 4. Severe COPD we will continue with medical management we will continue to monitor closely 5. Chronic heart failure preserved ejection fraction appears   I have personally seen and evaluated the patient, evaluated laboratory and imaging results, formulated the assessment and plan and placed orders. The Patient requires high complexity decision making with multiple systems involvement.  Rounds were done with the Respiratory Therapy Director and Staff therapists and discussed with nursing staff also.  Allyne Gee, MD [facility] Pulmonary Critical Care Medicine Sleep Medicine

## 2020-04-19 ENCOUNTER — Other Ambulatory Visit (HOSPITAL_COMMUNITY): Payer: Medicare Other

## 2020-04-19 DIAGNOSIS — N184 Chronic kidney disease, stage 4 (severe): Secondary | ICD-10-CM | POA: Diagnosis not present

## 2020-04-19 DIAGNOSIS — I5032 Chronic diastolic (congestive) heart failure: Secondary | ICD-10-CM | POA: Diagnosis not present

## 2020-04-19 DIAGNOSIS — J449 Chronic obstructive pulmonary disease, unspecified: Secondary | ICD-10-CM | POA: Diagnosis not present

## 2020-04-19 DIAGNOSIS — J9621 Acute and chronic respiratory failure with hypoxia: Secondary | ICD-10-CM | POA: Diagnosis not present

## 2020-04-19 HISTORY — PX: IR FLUORO GUIDE CV LINE RIGHT: IMG2283

## 2020-04-19 HISTORY — PX: IR US GUIDE VASC ACCESS RIGHT: IMG2390

## 2020-04-19 LAB — URINE CULTURE: Culture: <10000 — AB

## 2020-04-19 LAB — CBC
HCT: 33 % — ABNORMAL LOW (ref 39.0–52.0)
Hemoglobin: 10 g/dL — ABNORMAL LOW (ref 13.0–17.0)
MCH: 31.3 pg (ref 26.0–34.0)
MCHC: 30.3 g/dL (ref 30.0–36.0)
MCV: 103.4 fL — ABNORMAL HIGH (ref 80.0–100.0)
Platelets: 240 10*3/uL (ref 150–400)
RBC: 3.19 MIL/uL — ABNORMAL LOW (ref 4.22–5.81)
RDW: 15.3 % (ref 11.5–15.5)
WBC: 15.8 10*3/uL — ABNORMAL HIGH (ref 4.0–10.5)
nRBC: 0 % (ref 0.0–0.2)

## 2020-04-19 LAB — BASIC METABOLIC PANEL
Anion gap: 15 (ref 5–15)
BUN: 197 mg/dL — ABNORMAL HIGH (ref 8–23)
CO2: 19 mmol/L — ABNORMAL LOW (ref 22–32)
Calcium: 9.3 mg/dL (ref 8.9–10.3)
Chloride: 109 mmol/L (ref 98–111)
Creatinine, Ser: 3.21 mg/dL — ABNORMAL HIGH (ref 0.61–1.24)
GFR, Estimated: 21 mL/min — ABNORMAL LOW (ref >60)
Glucose, Bld: 125 mg/dL — ABNORMAL HIGH (ref 70–99)
Potassium: 3.1 mmol/L — ABNORMAL LOW (ref 3.5–5.1)
Sodium: 143 mmol/L (ref 135–145)

## 2020-04-19 LAB — PROTIME-INR
INR: 1.4 — ABNORMAL HIGH (ref 0.8–1.2)
Prothrombin Time: 16.6 seconds — ABNORMAL HIGH (ref 11.4–15.2)

## 2020-04-19 MED ORDER — LIDOCAINE HCL (PF) 1 % IJ SOLN
INTRAMUSCULAR | Status: DC | PRN
  Administered 2020-04-19: 5 mL

## 2020-04-19 MED ORDER — HEPARIN SODIUM (PORCINE) 1000 UNIT/ML IJ SOLN
INTRAMUSCULAR | Status: AC
  Filled 2020-04-19: qty 1

## 2020-04-19 MED ORDER — LIDOCAINE HCL (PF) 1 % IJ SOLN
INTRAMUSCULAR | Status: AC
  Filled 2020-04-19: qty 30

## 2020-04-19 NOTE — Progress Notes (Signed)
Pulmonary Critical Care Medicine [place]  PROGRESS NOTE     Antonio Chavez  [id number]  DOB: 08-07-1960   DOA: 04/04/2020  Referring Physician: Merton Border, MD  HPI: Antonio Chavez is a 60 y.o. male seen for follow up of Acute on Chronic Respiratory Failure.  Patient has had worsening of the renal failure and had to go back on the ventilator.  Seen by nephrology today for dialysis  Medications: Reviewed on Rounds  Physical Exam:  Vitals: Temperature is 98.2 pulse 82 respiratory 26 blood pressure is 157/88 saturations 100%  Ventilator Settings on T collar FiO2 28%  . General: Comfortable at this time . Eyes: Grossly normal lids, irises & conjunctiva . ENT: grossly tongue is normal . Neck: no obvious mass . Cardiovascular: S1 S2 normal no gallop . Respiratory: Scattered rhonchi expansion is equal . Abdomen: soft . Skin: no rash seen on limited exam . Musculoskeletal: not rigid . Psychiatric:unable to assess . Neurologic: no seizure no involuntary movements         Lab Data:   Basic Metabolic Panel: Recent Labs  Lab 04/13/20 0534 04/18/20 0618 04/19/20 1421  NA 138 144 143  K 3.5 3.9 3.1*  CL 105 110 109  CO2 23 22 19*  GLUCOSE 151* 162* 125*  BUN 116* 164* 197*  CREATININE 2.47* 2.98* 3.21*  CALCIUM 9.6 9.6 9.3    ABG: Recent Labs  Lab 04/14/20 0920 04/18/20 1053 04/18/20 1244  PHART 7.368 7.124* 7.256*  PCO2ART 41.9 71.8* 46.3  PO2ART 80.5* 58.9* 85.5  HCO3 23.6 22.7 19.1*  O2SAT 96.0 87.3 97.1    Liver Function Tests: No results for input(s): AST, ALT, ALKPHOS, BILITOT, PROT, ALBUMIN in the last 168 hours. No results for input(s): LIPASE, AMYLASE in the last 168 hours. No results for input(s): AMMONIA in the last 168 hours.  CBC: Recent Labs  Lab 04/13/20 0534 04/18/20 0618 04/19/20 1421  WBC 13.2* 23.6* 15.8*  HGB 9.4* 11.2* 10.0*  HCT 30.2* 37.8* 33.0*  MCV 99.3 104.4* 103.4*   PLT 328 322 240    Cardiac Enzymes: No results for input(s): CKTOTAL, CKMB, CKMBINDEX, TROPONINI in the last 168 hours.  BNP (last 3 results) No results for input(s): BNP in the last 8760 hours.  ProBNP (last 3 results) No results for input(s): PROBNP in the last 8760 hours.  Radiological Exams: DG Chest Port 1 View  Result Date: 04/18/2020 CLINICAL DATA:  Acute on chronic respiratory failure EXAM: PORTABLE CHEST 1 VIEW COMPARISON:  04/05/2020 FINDINGS: Single frontal view of the chest demonstrates tracheostomy 2 tip at thoracic inlet. Enteric catheter passes below diaphragm tip excluded by collimation. Cardiac silhouette is stable. Lung volumes are diminished, with bilateral areas of pleural and parenchymal scarring unchanged. No airspace disease, effusion, or pneumothorax. IMPRESSION: 1. Stable scarring, no acute process. Electronically Signed   By: Randa Ngo M.D.   On: 04/18/2020 16:06    Assessment/Plan Active Problems:   Acute on chronic respiratory failure with hypoxia (HCC)   Chronic kidney disease, stage IV (severe) (HCC)   Paroxysmal atrial fibrillation (HCC)   COPD, severe (HCC)   Chronic heart failure with preserved ejection fraction ([employer])   1. Acute on chronic respiratory failure hypoxia plan is to continue with T-piece titrate oxygen as tolerated continue pulmonary toilet. 2. Chronic kidney disease stage IV we will continue with supportive care 3. Paroxysmal atrial fibrillation rate controlled 4. Severe COPD medical management 5. Chronic heart failure  preserved ejection fraction no change   I have personally seen and evaluated the patient, evaluated laboratory and imaging results, formulated the assessment and plan and placed orders. The Patient requires high complexity decision making with multiple systems involvement.  Rounds were done with the Respiratory Therapy Director and Staff therapists and discussed with nursing staff also.  Allyne Gee, MD  [facility] Pulmonary Critical Care Medicine Sleep Medicine

## 2020-04-19 NOTE — Progress Notes (Signed)
[facility]  ROUNDING NOTE   Subjective:  Patient continues to have worsening azotemia. BUN up to 164 with a creatinine of 2.98. Still making urine at 1.2 L. Had additional decline in his condition and now back on the ventilator. Had discussion with hospitalist and we will initiate dialysis for worsening azotemia.   Objective:  Vital signs in last 24 hours:  Temperature 98.2 pulse 82 respirations 26 blood pressure 157/88  Physical Exam: General:  Critically ill-appearing  Head:  Normocephalic, atraumatic. Moist oral mucosal membranes  Eyes:  Anicteric  Neck:  Tracheostomy in place  Lungs:   Coarse breath sounds bilateral, vent assisted  Heart:  S1S2 no rubs  Abdomen:   Soft, nontender, bowel sounds present  Extremities:  Trace peripheral edema.  Neurologic:  Lethargic but arousable  Skin:  Bilateral upper extremity ecchymoses  Access:  No hemodialysis access currently    Basic Metabolic Panel: Recent Labs  Lab 04/13/20 0534 04/18/20 0618  NA 138 144  K 3.5 3.9  CL 105 110  CO2 23 22  GLUCOSE 151* 162*  BUN 116* 164*  CREATININE 2.47* 2.98*  CALCIUM 9.6 9.6    Liver Function Tests: No results for input(s): AST, ALT, ALKPHOS, BILITOT, PROT, ALBUMIN in the last 168 hours. No results for input(s): LIPASE, AMYLASE in the last 168 hours. No results for input(s): AMMONIA in the last 168 hours.  CBC: Recent Labs  Lab 04/13/20 0534 04/18/20 0618  WBC 13.2* 23.6*  HGB 9.4* 11.2*  HCT 30.2* 37.8*  MCV 99.3 104.4*  PLT 328 322    Cardiac Enzymes: No results for input(s): CKTOTAL, CKMB, CKMBINDEX, TROPONINI in the last 168 hours.  BNP: Invalid input(s): POCBNP  CBG: No results for input(s): GLUCAP in the last 168 hours.  Microbiology: Results for orders placed or performed during the hospital encounter of 04/04/20  Culture, Respiratory w Gram Stain     Status: None   Collection Time: 04/05/20 12:23 PM   Specimen: Tracheal Aspirate;  Respiratory  Result Value Ref Range Status   Specimen Description TRACHEAL ASPIRATE  Final   Special Requests NONE  Final   Gram Stain   Final    FEW WBC PRESENT, PREDOMINANTLY PMN ABUNDANT GRAM NEGATIVE RODS FEW GRAM POSITIVE COCCI IN PAIRS IN CLUSTERS Performed at [facility], [address] [place], [place] [address]    Culture ABUNDANT PSEUDOMONAS AERUGINOSA  Final   Report Status 04/07/2020 FINAL  Final   Organism ID, Bacteria PSEUDOMONAS AERUGINOSA  Final      Susceptibility   Pseudomonas aeruginosa - MIC*    CEFTAZIDIME 8 SENSITIVE Sensitive     CIPROFLOXACIN 0.5 SENSITIVE Sensitive     GENTAMICIN <=1 SENSITIVE Sensitive     IMIPENEM <=0.25 SENSITIVE Sensitive     * ABUNDANT PSEUDOMONAS AERUGINOSA  Culture, blood (routine x 2)     Status: None   Collection Time: 04/05/20 12:41 PM   Specimen: BLOOD LEFT HAND  Result Value Ref Range Status   Specimen Description BLOOD LEFT HAND  Final   Special Requests   Final    BOTTLES DRAWN AEROBIC AND ANAEROBIC Blood Culture adequate volume   Culture   Final    NO GROWTH 6 DAYS Performed at [facility], [address] [place], [place] [address]    Report Status 04/11/2020 FINAL  Final  Culture, blood (routine x 2)     Status: None   Collection Time: 04/05/20 12:50 PM   Specimen: BLOOD  RIGHT HAND  Result Value Ref Range Status   Specimen Description BLOOD RIGHT HAND  Final   Special Requests   Final    BOTTLES DRAWN AEROBIC AND ANAEROBIC Blood Culture results may not be optimal due to an inadequate volume of blood received in culture bottles   Culture   Final    NO GROWTH 6 DAYS Performed at [facility], [place] [address], [place], [place] [address]    Report Status 04/11/2020 FINAL  Final  Culture, Respiratory w Gram Stain     Status: None (Preliminary result)   Collection Time: 04/18/20  1:18 PM   Specimen: Tracheal Aspirate; Respiratory  Result Value Ref Range Status   Specimen Description TRACHEAL  ASPIRATE  Final   Special Requests NONE  Final   Gram Stain   Final    FEW WBC PRESENT, PREDOMINANTLY PMN ABUNDANT GRAM NEGATIVE RODS RARE GRAM POSITIVE COCCI Performed at [facility], [place] [address], [place], [place] [address]    Culture PENDING  Incomplete   Report Status PENDING  Incomplete    Coagulation Studies: No results for input(s): LABPROT, INR in the last 72 hours.  Urinalysis: Recent Labs    04/18/20 1318  COLORURINE YELLOW  LABSPEC 1.016  PHURINE 5.0  GLUCOSEU NEGATIVE  HGBUR NEGATIVE  BILIRUBINUR NEGATIVE  KETONESUR NEGATIVE  PROTEINUR 100*  NITRITE NEGATIVE  LEUKOCYTESUR NEGATIVE      Imaging: DG Chest Port 1 View  Result Date: 04/18/2020 CLINICAL DATA:  Acute on chronic respiratory failure EXAM: PORTABLE CHEST 1 VIEW COMPARISON:  04/05/2020 FINDINGS: Single frontal view of the chest demonstrates tracheostomy 2 tip at thoracic inlet. Enteric catheter passes below diaphragm tip excluded by collimation. Cardiac silhouette is stable. Lung volumes are diminished, with bilateral areas of pleural and parenchymal scarring unchanged. No airspace disease, effusion, or pneumothorax. IMPRESSION: 1. Stable scarring, no acute process. Electronically Signed   By: Randa Ngo M.D.   On: 04/18/2020 16:06     Medications:       Assessment/ Plan:  60 y.o. male with a PMHx of acute on chronic respiratory failure status post tracheostomy placement, paroxysmal atrial fibrillation, acute diastolic heart failure, chronic kidney disease stage IV, nonsustained ventricular tachycardia, obstructive sleep apnea, COPD, hypertension, history of status epilepticus, who was admitted to [facility] on 04/04/2020 for ongoing care.  1.  Acute kidney injury/chronic kidney disease stage IV.  When the patient left outside hospital his EGFR was 49.   -Azotemia is worsened over the past 2 weeks.  Still making urine at 1.2 L over the preceding 24 hours.  Case discussed with  hospitalist.  We will proceed with a course of renal placement therapy did improve his mental status due to his azotemia.  2.  Acute respiratory failure.  Patient now back on the ventilator.  Hopefully with some fluid removal we can help to liberate the patient from the ventilator.  3.  Anemia of chronic kidney disease.  Hemoglobin up to 11.2.  Continue to monitor CBC.   LOS: 0 Analiza Cowger 4/13/20228:02 AM

## 2020-04-19 NOTE — Procedures (Signed)
Interventional Radiology Procedure Note  Procedure: Placement of a right IJ approach triple lumen non-tunneled HD catheter.   Tip is positioned at the superior cavoatrial junction and catheter is ready for immediate use. 20cm temp cath.  Complications: None  Recommendations:  - Ok to use - Do not submerge - Routine line care .   Signed,  Dulcy Fanny. Earleen Newport, DO

## 2020-04-20 DIAGNOSIS — N184 Chronic kidney disease, stage 4 (severe): Secondary | ICD-10-CM | POA: Diagnosis not present

## 2020-04-20 DIAGNOSIS — J9621 Acute and chronic respiratory failure with hypoxia: Secondary | ICD-10-CM | POA: Diagnosis not present

## 2020-04-20 DIAGNOSIS — I5032 Chronic diastolic (congestive) heart failure: Secondary | ICD-10-CM | POA: Diagnosis not present

## 2020-04-20 DIAGNOSIS — J449 Chronic obstructive pulmonary disease, unspecified: Secondary | ICD-10-CM | POA: Diagnosis not present

## 2020-04-20 LAB — HEPATITIS B SURFACE ANTIGEN: Hepatitis B Surface Ag: NONREACTIVE

## 2020-04-20 LAB — VANCOMYCIN, RANDOM: Vancomycin Rm: 13

## 2020-04-20 NOTE — Progress Notes (Signed)
Pulmonary Critical Care Medicine [place]  PROGRESS NOTE     Antonio Chavez  JIR:[id number]  DOB: 02-11-1960   DOA: 04/04/2020  Referring Physician: Merton Border, MD  HPI: Antonio Chavez is a 60 y.o. male seen for follow up of Acute on Chronic Respiratory Failure.  The patient is resting comfortably right now on assist control mode has been on 28% FiO2  Medications: Reviewed on Rounds  Physical Exam:  Vitals: Temperature is 97.3 pulse 72 respiratory 21 blood pressure is 132/78 saturations 100%  Ventilator Settings are assist-control FiO2 28 % tidal volume of 500 PEEP 5  . General: Comfortable at this time . Eyes: Grossly normal lids, irises & conjunctiva . ENT: grossly tongue is normal . Neck: no obvious mass . Cardiovascular: S1 S2 normal no gallop . Respiratory: Scattered rhonchi no rales are noted at this time . Abdomen: soft . Skin: no rash seen on limited exam . Musculoskeletal: not rigid . Psychiatric:unable to assess . Neurologic: no seizure no involuntary movements         Lab Data:   Basic Metabolic Panel: Recent Labs  Lab 04/18/20 0618 04/19/20 1421  NA 144 143  K 3.9 3.1*  CL 110 109  CO2 22 19*  GLUCOSE 162* 125*  BUN 164* 197*  CREATININE 2.98* 3.21*  CALCIUM 9.6 9.3    ABG: Recent Labs  Lab 04/14/20 0920 04/18/20 1053 04/18/20 1244  PHART 7.368 7.124* 7.256*  PCO2ART 41.9 71.8* 46.3  PO2ART 80.5* 58.9* 85.5  HCO3 23.6 22.7 19.1*  O2SAT 96.0 87.3 97.1    Liver Function Tests: No results for input(s): AST, ALT, ALKPHOS, BILITOT, PROT, ALBUMIN in the last 168 hours. No results for input(s): LIPASE, AMYLASE in the last 168 hours. No results for input(s): AMMONIA in the last 168 hours.  CBC: Recent Labs  Lab 04/18/20 0618 04/19/20 1421  WBC 23.6* 15.8*  HGB 11.2* 10.0*  HCT 37.8* 33.0*  MCV 104.4* 103.4*  PLT 322 240    Cardiac Enzymes: No results for input(s): CKTOTAL,  CKMB, CKMBINDEX, TROPONINI in the last 168 hours.  BNP (last 3 results) No results for input(s): BNP in the last 8760 hours.  ProBNP (last 3 results) No results for input(s): PROBNP in the last 8760 hours.  Radiological Exams: IR Fluoro Guide CV Line Right  Result Date: 04/19/2020 INDICATION: 61 year old male referred for hemodialysis catheter placement EXAM: IMAGE GUIDED NON TUNNELED HEMODIALYSIS CATHETER MEDICATIONS: None ANESTHESIA/SEDATION: None FLUOROSCOPY TIME:  Fluoroscopy Time: 0 minutes 6 seconds (1 mGy). COMPLICATIONS: None PROCEDURE: Informed written consent was obtained from the patient's family after a discussion of the risks, benefits, and alternatives to treatment. Questions regarding the procedure were encouraged and answered. The right neck was prepped with chlorhexidine in a sterile fashion, and a sterile drape was applied covering the operative field. Maximum barrier sterile technique with sterile gowns and gloves were used for the procedure. A timeout was performed prior to the initiation of the procedure. A micropuncture kit was utilized to access the right internal jugular vein under direct, real-time ultrasound guidance after the overlying soft tissues were anesthetized with 1% lidocaine with epinephrine. Ultrasound image documentation was performed. The microwire was kinked to measure appropriate catheter length. A stiff glidewire was advanced to the level of the IVC. A 20 cm hemodialysis catheter was then placed over the wire. Final catheter positioning was confirmed and documented with a spot radiographic image. The catheter aspirates and flushes normally. The  catheter was flushed with appropriate volume heparin dwells. Dressings were applied. The patient tolerated the procedure well without immediate post procedural complication. IMPRESSION: Status post image guided placement of right IJ Trialysis non tunneled hemodialysis catheter. Signed, Dulcy Fanny. Dellia Nims, RPVI Vascular and  Interventional Radiology Specialists [facility] Radiology Electronically Signed   By: Corrie Mckusick D.O.   On: 04/19/2020 17:11   IR US Guide Vasc Access Right  Result Date: 04/19/2020 INDICATION: 60 year old male referred for hemodialysis catheter placement EXAM: IMAGE GUIDED NON TUNNELED HEMODIALYSIS CATHETER MEDICATIONS: None ANESTHESIA/SEDATION: None FLUOROSCOPY TIME:  Fluoroscopy Time: 0 minutes 6 seconds (1 mGy). COMPLICATIONS: None PROCEDURE: Informed written consent was obtained from the patient's family after a discussion of the risks, benefits, and alternatives to treatment. Questions regarding the procedure were encouraged and answered. The right neck was prepped with chlorhexidine in a sterile fashion, and a sterile drape was applied covering the operative field. Maximum barrier sterile technique with sterile gowns and gloves were used for the procedure. A timeout was performed prior to the initiation of the procedure. A micropuncture kit was utilized to access the right internal jugular vein under direct, real-time ultrasound guidance after the overlying soft tissues were anesthetized with 1% lidocaine with epinephrine. Ultrasound image documentation was performed. The microwire was kinked to measure appropriate catheter length. A stiff glidewire was advanced to the level of the IVC. A 20 cm hemodialysis catheter was then placed over the wire. Final catheter positioning was confirmed and documented with a spot radiographic image. The catheter aspirates and flushes normally. The catheter was flushed with appropriate volume heparin dwells. Dressings were applied. The patient tolerated the procedure well without immediate post procedural complication. IMPRESSION: Status post image guided placement of right IJ Trialysis non tunneled hemodialysis catheter. Signed, Dulcy Fanny. Dellia Nims, RPVI Vascular and Interventional Radiology Specialists [facility] Radiology Electronically Signed   By: Corrie Mckusick  D.O.   On: 04/19/2020 17:11   DG Chest Port 1 View  Result Date: 04/18/2020 CLINICAL DATA:  Acute on chronic respiratory failure EXAM: PORTABLE CHEST 1 VIEW COMPARISON:  04/05/2020 FINDINGS: Single frontal view of the chest demonstrates tracheostomy 2 tip at thoracic inlet. Enteric catheter passes below diaphragm tip excluded by collimation. Cardiac silhouette is stable. Lung volumes are diminished, with bilateral areas of pleural and parenchymal scarring unchanged. No airspace disease, effusion, or pneumothorax. IMPRESSION: 1. Stable scarring, no acute process. Electronically Signed   By: Randa Ngo M.D.   On: 04/18/2020 16:06    Assessment/Plan Active Problems:   Acute on chronic respiratory failure with hypoxia (HCC)   Chronic kidney disease, stage IV (severe) (HCC)   Paroxysmal atrial fibrillation (HCC)   COPD, severe (HCC)   Chronic heart failure with preserved ejection fraction ([place])   1. Acute on chronic respiratory failure with hypoxia the plan is going to be to continue with reassessing for wean now since we are getting the dialysis started. 2. Chronic kidney disease stage IV no change we will continue to follow 3. Paroxysmal atrial fibrillation rate controlled 4. Severe COPD medical management 5. Chronic heart failure preserved ejection fraction at baseline   I have personally seen and evaluated the patient, evaluated laboratory and imaging results, formulated the assessment and plan and placed orders. The Patient requires high complexity decision making with multiple systems involvement.  Rounds were done with the Respiratory Therapy Director and Staff therapists and discussed with nursing staff also.  Allyne Gee, MD [facility] Pulmonary Critical Care Medicine Sleep Medicine

## 2020-04-21 DIAGNOSIS — J449 Chronic obstructive pulmonary disease, unspecified: Secondary | ICD-10-CM | POA: Diagnosis not present

## 2020-04-21 DIAGNOSIS — I5032 Chronic diastolic (congestive) heart failure: Secondary | ICD-10-CM | POA: Diagnosis not present

## 2020-04-21 DIAGNOSIS — J9621 Acute and chronic respiratory failure with hypoxia: Secondary | ICD-10-CM | POA: Diagnosis not present

## 2020-04-21 DIAGNOSIS — N184 Chronic kidney disease, stage 4 (severe): Secondary | ICD-10-CM | POA: Diagnosis not present

## 2020-04-21 LAB — CULTURE, RESPIRATORY W GRAM STAIN

## 2020-04-21 LAB — CBC
HCT: 32.5 % — ABNORMAL LOW (ref 39.0–52.0)
Hemoglobin: 10 g/dL — ABNORMAL LOW (ref 13.0–17.0)
MCH: 31.3 pg (ref 26.0–34.0)
MCHC: 30.8 g/dL (ref 30.0–36.0)
MCV: 101.6 fL — ABNORMAL HIGH (ref 80.0–100.0)
Platelets: 211 10*3/uL (ref 150–400)
RBC: 3.2 MIL/uL — ABNORMAL LOW (ref 4.22–5.81)
RDW: 15.6 % — ABNORMAL HIGH (ref 11.5–15.5)
WBC: 14 10*3/uL — ABNORMAL HIGH (ref 4.0–10.5)
nRBC: 0 % (ref 0.0–0.2)

## 2020-04-21 LAB — BASIC METABOLIC PANEL
Anion gap: 7 (ref 5–15)
BUN: 122 mg/dL — ABNORMAL HIGH (ref 8–23)
CO2: 22 mmol/L (ref 22–32)
Calcium: 8.7 mg/dL — ABNORMAL LOW (ref 8.9–10.3)
Chloride: 109 mmol/L (ref 98–111)
Creatinine, Ser: 2.28 mg/dL — ABNORMAL HIGH (ref 0.61–1.24)
GFR, Estimated: 32 mL/min — ABNORMAL LOW (ref >60)
Glucose, Bld: 113 mg/dL — ABNORMAL HIGH (ref 70–99)
Potassium: 2.9 mmol/L — ABNORMAL LOW (ref 3.5–5.1)
Sodium: 138 mmol/L (ref 135–145)

## 2020-04-21 LAB — VANCOMYCIN, TROUGH: Vancomycin Tr: 21 ug/mL (ref 15–20)

## 2020-04-21 LAB — LEVETIRACETAM LEVEL: Levetiracetam Lvl: 23.9 ug/mL (ref 10.0–40.0)

## 2020-04-21 NOTE — Progress Notes (Signed)
Pulmonary Critical Care Medicine [place]  PROGRESS NOTE     Antonio Chavez  KKX:[id number]  DOB: [birth year]   DOA: 04/04/2020  Referring Physician: Merton Border, MD  HPI: Antonio Chavez is a 60 y.o. male seen for follow up of Acute on Chronic Respiratory Failure. At this time patient is full support was able to do 2 hours of pressure support weaning  Medications: Reviewed on Rounds  Physical Exam:  Vitals: Temperature 97.2 pulse 71 respiratory 24 blood pressure 142/86 saturations 99%  Ventilator Settings on assist control FiO2 28% PEEP 5  . General: Comfortable at this time . Eyes: Grossly normal lids, irises & conjunctiva . ENT: grossly tongue is normal . Neck: no obvious mass . Cardiovascular: S1 S2 normal no gallop . Respiratory: Scattered rhonchi expansion is equal . Abdomen: soft . Skin: no rash seen on limited exam . Musculoskeletal: not rigid . Psychiatric:unable to assess . Neurologic: no seizure no involuntary movements         Lab Data:   Basic Metabolic Panel: Recent Labs  Lab 04/18/20 0618 04/19/20 1421 04/21/20 0454  NA 144 143 138  K 3.9 3.1* 2.9*  CL 110 109 109  CO2 22 19* 22  GLUCOSE 162* 125* 113*  BUN 164* 197* 122*  CREATININE 2.98* 3.21* 2.28*  CALCIUM 9.6 9.3 8.7*    ABG: Recent Labs  Lab 04/18/20 1053 04/18/20 1244  PHART 7.124* 7.256*  PCO2ART 71.8* 46.3  PO2ART 58.9* 85.5  HCO3 22.7 19.1*  O2SAT 87.3 97.1    Liver Function Tests: No results for input(s): AST, ALT, ALKPHOS, BILITOT, PROT, ALBUMIN in the last 168 hours. No results for input(s): LIPASE, AMYLASE in the last 168 hours. No results for input(s): AMMONIA in the last 168 hours.  CBC: Recent Labs  Lab 04/18/20 0618 04/19/20 1421 04/21/20 0454  WBC 23.6* 15.8* 14.0*  HGB 11.2* 10.0* 10.0*  HCT 37.8* 33.0* 32.5*  MCV 104.4* 103.4* 101.6*  PLT 322 240 211    Cardiac Enzymes: No results for input(s):  CKTOTAL, CKMB, CKMBINDEX, TROPONINI in the last 168 hours.  BNP (last 3 results) No results for input(s): BNP in the last 8760 hours.  ProBNP (last 3 results) No results for input(s): PROBNP in the last 8760 hours.  Radiological Exams: IR Fluoro Guide CV Line Right  Result Date: 04/19/2020 INDICATION: 60 year old male referred for hemodialysis catheter placement EXAM: IMAGE GUIDED NON TUNNELED HEMODIALYSIS CATHETER MEDICATIONS: None ANESTHESIA/SEDATION: None FLUOROSCOPY TIME:  Fluoroscopy Time: 0 minutes 6 seconds (1 mGy). COMPLICATIONS: None PROCEDURE: Informed written consent was obtained from the patient's family after a discussion of the risks, benefits, and alternatives to treatment. Questions regarding the procedure were encouraged and answered. The right neck was prepped with chlorhexidine in a sterile fashion, and a sterile drape was applied covering the operative field. Maximum barrier sterile technique with sterile gowns and gloves were used for the procedure. A timeout was performed prior to the initiation of the procedure. A micropuncture kit was utilized to access the right internal jugular vein under direct, real-time ultrasound guidance after the overlying soft tissues were anesthetized with 1% lidocaine with epinephrine. Ultrasound image documentation was performed. The microwire was kinked to measure appropriate catheter length. A stiff glidewire was advanced to the level of the IVC. A 20 cm hemodialysis catheter was then placed over the wire. Final catheter positioning was confirmed and documented with a spot radiographic image. The catheter aspirates and flushes normally. The  catheter was flushed with appropriate volume heparin dwells. Dressings were applied. The patient tolerated the procedure well without immediate post procedural complication. IMPRESSION: Status post image guided placement of right IJ Trialysis non tunneled hemodialysis catheter. Signed, Dulcy Fanny. Dellia Nims, RPVI  [facility] Radiology Electronically Signed   By: Corrie Mckusick D.O.   On: 04/19/2020 17:11   IR US Guide Vasc Access Right  Result Date: 04/19/2020 INDICATION: 60 year old male referred for hemodialysis catheter placement EXAM: IMAGE GUIDED NON TUNNELED HEMODIALYSIS CATHETER MEDICATIONS: None ANESTHESIA/SEDATION: None FLUOROSCOPY TIME:  Fluoroscopy Time: 0 minutes 6 seconds (1 mGy). COMPLICATIONS: None PROCEDURE: Informed written consent was obtained from the patient's family after a discussion of the risks, benefits, and alternatives to treatment. Questions regarding the procedure were encouraged and answered. The right neck was prepped with chlorhexidine in a sterile fashion, and a sterile drape was applied covering the operative field. Maximum barrier sterile technique with sterile gowns and gloves were used for the procedure. A timeout was performed prior to the initiation of the procedure. A micropuncture kit was utilized to access the right internal jugular vein under direct, real-time ultrasound guidance after the overlying soft tissues were anesthetized with 1% lidocaine with epinephrine. Ultrasound image documentation was performed. The microwire was kinked to measure appropriate catheter length. A stiff glidewire was advanced to the level of the IVC. A 20 cm hemodialysis catheter was then placed over the wire. Final catheter positioning was confirmed and documented with a spot radiographic image. The catheter aspirates and flushes normally. The catheter was flushed with appropriate volume heparin dwells. Dressings were applied. The patient tolerated the procedure well without immediate post procedural complication. IMPRESSION: Status post image guided placement of right IJ Trialysis non tunneled hemodialysis catheter. Signed, Dulcy Fanny. Dellia Nims, RPVI [facility] Radiology Electronically Signed   By:  Corrie Mckusick D.O.   On: 04/19/2020 17:11    Assessment/Plan Active Problems:   Acute on chronic respiratory failure with hypoxia (HCC)   Chronic kidney disease, stage IV (severe) (HCC)   Paroxysmal atrial fibrillation (HCC)   COPD, severe (HCC)   Chronic heart failure with preserved ejection fraction ([place])   1. Acute on chronic respiratory failure hypoxia plan is going to continue with the full support on assist-control mode at this time.  We will continue pulmonary toilet supportive care. 2. Chronic kidney disease stage IV we will continue present therapy 3. Paroxysmal atrial fibrillation rate is controlled 4. Severe COPD medical management 5. Chronic heart failure preserved ejection fraction at baseline   I have personally seen and evaluated the patient, evaluated laboratory and imaging results, formulated the assessment and plan and placed orders. The Patient requires high complexity decision making with multiple systems involvement.  Rounds were done with the Respiratory Therapy Director and Staff therapists and discussed with nursing staff also.  Allyne Gee, MD [facility] Pulmonary Critical Care Medicine Sleep Medicine

## 2020-04-21 NOTE — Progress Notes (Signed)
[facility]  ROUNDING NOTE   Subjective:  Azotemia has improved a bit. BUN down to 122 and creatinine down to 2.2. Potassium a bit low at 2.9.   Objective:  Vital signs in last 24 hours:  Pulse 62 respirations 34 blood pressure 124/71 pulse ox 100%  Physical Exam: General:  Critically ill-appearing  Head:  Normocephalic, atraumatic. Moist oral mucosal membranes  Eyes:  Anicteric  Neck:  Tracheostomy in place  Lungs:   Coarse breath sounds bilateral, vent assisted  Heart:  S1S2 no rubs  Abdomen:   Soft, nontender, bowel sounds present  Extremities:  Trace peripheral edema.  Neurologic:  Awake, alert  Skin:  Bilateral upper extremity ecchymoses  Access:  Right IJ temporary dialysis catheter    Basic Metabolic Panel: Recent Labs  Lab 04/18/20 0618 04/19/20 1421 04/21/20 0454  NA 144 143 138  K 3.9 3.1* 2.9*  CL 110 109 109  CO2 22 19* 22  GLUCOSE 162* 125* 113*  BUN 164* 197* 122*  CREATININE 2.98* 3.21* 2.28*  CALCIUM 9.6 9.3 8.7*    Liver Function Tests: No results for input(s): AST, ALT, ALKPHOS, BILITOT, PROT, ALBUMIN in the last 168 hours. No results for input(s): LIPASE, AMYLASE in the last 168 hours. No results for input(s): AMMONIA in the last 168 hours.  CBC: Recent Labs  Lab 04/18/20 0618 04/19/20 1421 04/21/20 0454  WBC 23.6* 15.8* 14.0*  HGB 11.2* 10.0* 10.0*  HCT 37.8* 33.0* 32.5*  MCV 104.4* 103.4* 101.6*  PLT 322 240 211    Cardiac Enzymes: No results for input(s): CKTOTAL, CKMB, CKMBINDEX, TROPONINI in the last 168 hours.  BNP: Invalid input(s): POCBNP  CBG: No results for input(s): GLUCAP in the last 168 hours.  Microbiology: Results for orders placed or performed during the hospital encounter of 04/04/20  Culture, Respiratory w Gram Stain     Status: None   Collection Time: 04/05/20 12:23 PM   Specimen: Tracheal Aspirate; Respiratory  Result Value Ref Range Status   Specimen Description TRACHEAL ASPIRATE  Final    Special Requests NONE  Final   Gram Stain   Final    FEW WBC PRESENT, PREDOMINANTLY PMN ABUNDANT GRAM NEGATIVE RODS FEW GRAM POSITIVE COCCI IN PAIRS IN CLUSTERS Performed at [facility], [address], [place], [place] [address]    Culture ABUNDANT PSEUDOMONAS AERUGINOSA  Final   Report Status 04/07/2020 FINAL  Final   Organism ID, Bacteria PSEUDOMONAS AERUGINOSA  Final      Susceptibility   Pseudomonas aeruginosa - MIC*    CEFTAZIDIME 8 SENSITIVE Sensitive     CIPROFLOXACIN 0.5 SENSITIVE Sensitive     GENTAMICIN <=1 SENSITIVE Sensitive     IMIPENEM <=0.25 SENSITIVE Sensitive     * ABUNDANT PSEUDOMONAS AERUGINOSA  Culture, blood (routine x 2)     Status: None   Collection Time: 04/05/20 12:41 PM   Specimen: BLOOD LEFT HAND  Result Value Ref Range Status   Specimen Description BLOOD LEFT HAND  Final   Special Requests   Final    BOTTLES DRAWN AEROBIC AND ANAEROBIC Blood Culture adequate volume   Culture   Final    NO GROWTH 6 DAYS Performed at [facility], [address] [place], [place] [address]    Report Status 04/11/2020 FINAL  Final  Culture, blood (routine x 2)     Status: None   Collection Time: 04/05/20 12:50 PM   Specimen: BLOOD RIGHT HAND  Result Value Ref Range  Status   Specimen Description BLOOD RIGHT HAND  Final   Special Requests   Final    BOTTLES DRAWN AEROBIC AND ANAEROBIC Blood Culture results may not be optimal due to an inadequate volume of blood received in culture bottles   Culture   Final    NO GROWTH 6 DAYS Performed at [facility], [place] [address], [place], [place] [address]    Report Status 04/11/2020 FINAL  Final  Culture, Respiratory w Gram Stain     Status: None (Preliminary result)   Collection Time: 04/18/20  1:18 PM   Specimen: Tracheal Aspirate; Respiratory  Result Value Ref Range Status   Specimen Description TRACHEAL ASPIRATE  Final   Special Requests NONE  Final   Gram Stain   Final    FEW WBC PRESENT,  PREDOMINANTLY PMN ABUNDANT GRAM NEGATIVE RODS RARE GRAM POSITIVE COCCI    Culture   Final    ABUNDANT PSEUDOMONAS AERUGINOSA SUSCEPTIBILITIES TO FOLLOW Performed at [facility], [place] [address], [place], [place] [address]    Report Status PENDING  Incomplete  Culture, Urine     Status: Abnormal   Collection Time: 04/18/20  1:19 PM   Specimen: Urine, Random  Result Value Ref Range Status   Specimen Description URINE, RANDOM  Final   Special Requests NONE  Final   Culture (A)  Final    <10,000 COLONIES/mL INSIGNIFICANT GROWTH Performed at [facility], [place] [address] [place], [place] [address]    Report Status 04/19/2020 FINAL  Final  Culture, blood (routine x 2)     Status: None (Preliminary result)   Collection Time: 04/18/20  1:30 PM   Specimen: BLOOD  Result Value Ref Range Status   Specimen Description BLOOD RIGHT ANTECUBITAL  Final   Special Requests   Final    BOTTLES DRAWN AEROBIC ONLY Blood Culture results may not be optimal due to an excessive volume of blood received in culture bottles   Culture   Final    NO GROWTH 2 DAYS Performed at [facility], [place] [address], [place], [place] [address]    Report Status PENDING  Incomplete  Culture, blood (routine x 2)     Status: None (Preliminary result)   Collection Time: 04/18/20  1:43 PM   Specimen: BLOOD  Result Value Ref Range Status   Specimen Description BLOOD BLOOD RIGHT FOREARM  Final   Special Requests   Final    BOTTLES DRAWN AEROBIC AND ANAEROBIC Blood Culture adequate volume   Culture   Final    NO GROWTH 2 DAYS Performed at [facility], [place] [address], [place], [place] [address]    Report Status PENDING  Incomplete    Coagulation Studies: Recent Labs    04/19/20 1421  LABPROT 16.6*  INR 1.4*    Urinalysis: Recent Labs    04/18/20 1318  COLORURINE YELLOW  LABSPEC 1.016  PHURINE 5.0  GLUCOSEU NEGATIVE  HGBUR NEGATIVE  BILIRUBINUR NEGATIVE  KETONESUR NEGATIVE   PROTEINUR 100*  NITRITE NEGATIVE  LEUKOCYTESUR NEGATIVE      Imaging: IR Fluoro Guide CV Line Right  Result Date: 04/19/2020 INDICATION: 60 year old male referred for hemodialysis catheter placement EXAM: IMAGE GUIDED NON TUNNELED HEMODIALYSIS CATHETER MEDICATIONS: None ANESTHESIA/SEDATION: None FLUOROSCOPY TIME:  Fluoroscopy Time: 0 minutes 6 seconds (1 mGy). COMPLICATIONS: None PROCEDURE: Informed written consent was obtained from the patient's family after a discussion of the risks, benefits, and alternatives to treatment. Questions regarding the  procedure were encouraged and answered. The right neck was prepped with chlorhexidine in a sterile fashion, and a sterile drape was applied covering the operative field. Maximum barrier sterile technique with sterile gowns and gloves were used for the procedure. A timeout was performed prior to the initiation of the procedure. A micropuncture kit was utilized to access the right internal jugular vein under direct, real-time ultrasound guidance after the overlying soft tissues were anesthetized with 1% lidocaine with epinephrine. Ultrasound image documentation was performed. The microwire was kinked to measure appropriate catheter length. A stiff glidewire was advanced to the level of the IVC. A 20 cm hemodialysis catheter was then placed over the wire. Final catheter positioning was confirmed and documented with a spot radiographic image. The catheter aspirates and flushes normally. The catheter was flushed with appropriate volume heparin dwells. Dressings were applied. The patient tolerated the procedure well without immediate post procedural complication. IMPRESSION: Status post image guided placement of right IJ Trialysis non tunneled hemodialysis catheter. Signed, Dulcy Fanny. Dellia Nims, RPVI [facility] Radiology Electronically Signed   By: Corrie Mckusick D.O.   On: 04/19/2020 17:11   IR US Guide Vasc Access  Right  Result Date: 04/19/2020 INDICATION: 60 year old male referred for hemodialysis catheter placement EXAM: IMAGE GUIDED NON TUNNELED HEMODIALYSIS CATHETER MEDICATIONS: None ANESTHESIA/SEDATION: None FLUOROSCOPY TIME:  Fluoroscopy Time: 0 minutes 6 seconds (1 mGy). COMPLICATIONS: None PROCEDURE: Informed written consent was obtained from the patient's family after a discussion of the risks, benefits, and alternatives to treatment. Questions regarding the procedure were encouraged and answered. The right neck was prepped with chlorhexidine in a sterile fashion, and a sterile drape was applied covering the operative field. Maximum barrier sterile technique with sterile gowns and gloves were used for the procedure. A timeout was performed prior to the initiation of the procedure. A micropuncture kit was utilized to access the right internal jugular vein under direct, real-time ultrasound guidance after the overlying soft tissues were anesthetized with 1% lidocaine with epinephrine. Ultrasound image documentation was performed. The microwire was kinked to measure appropriate catheter length. A stiff glidewire was advanced to the level of the IVC. A 20 cm hemodialysis catheter was then placed over the wire. Final catheter positioning was confirmed and documented with a spot radiographic image. The catheter aspirates and flushes normally. The catheter was flushed with appropriate volume heparin dwells. Dressings were applied. The patient tolerated the procedure well without immediate post procedural complication. IMPRESSION: Status post image guided placement of right IJ Trialysis non tunneled hemodialysis catheter. Signed, Dulcy Fanny. Dellia Nims, RPVI [facility] Radiology Electronically Signed   By: Corrie Mckusick D.O.   On: 04/19/2020 17:11     Medications:       Assessment/ Plan:  60 y.o. male with a PMHx of acute on chronic respiratory failure status post  tracheostomy placement, paroxysmal atrial fibrillation, acute diastolic heart failure, chronic kidney disease stage IV, nonsustained ventricular tachycardia, obstructive sleep apnea, COPD, hypertension, history of status epilepticus, who was admitted to [facility] on 04/04/2020 for ongoing care.  1.  Acute kidney injury/chronic kidney disease stage IV.  When the patient left outside hospital his EGFR was 49.   -Azotemia improved.  BUN down to 122 and creatinine down to 2.28.  We will plan for hemodialysis treatment again tomorrow.  2.  Acute respiratory failure.  Patient remains on the ventilator.  Continue ventilatory support at this time.  3.  Anemia of chronic  kidney disease.  Hemoglobin 10.0.  Hold off on Epogen for now.  4.  Hypokalemia.  Serum potassium 2.9.  We plan to replete this today.   LOS: 0 Tishawn Friedhoff 4/15/20227:27 AM

## 2020-04-22 LAB — BASIC METABOLIC PANEL
Anion gap: 8 (ref 5–15)
BUN: 127 mg/dL — ABNORMAL HIGH (ref 8–23)
CO2: 22 mmol/L (ref 22–32)
Calcium: 8.8 mg/dL — ABNORMAL LOW (ref 8.9–10.3)
Chloride: 115 mmol/L — ABNORMAL HIGH (ref 98–111)
Creatinine, Ser: 2.45 mg/dL — ABNORMAL HIGH (ref 0.61–1.24)
GFR, Estimated: 29 mL/min — ABNORMAL LOW (ref >60)
Glucose, Bld: 125 mg/dL — ABNORMAL HIGH (ref 70–99)
Potassium: 3.3 mmol/L — ABNORMAL LOW (ref 3.5–5.1)
Sodium: 145 mmol/L (ref 135–145)

## 2020-04-22 LAB — CBC
HCT: 31.1 % — ABNORMAL LOW (ref 39.0–52.0)
Hemoglobin: 9.5 g/dL — ABNORMAL LOW (ref 13.0–17.0)
MCH: 30.7 pg (ref 26.0–34.0)
MCHC: 30.5 g/dL (ref 30.0–36.0)
MCV: 100.6 fL — ABNORMAL HIGH (ref 80.0–100.0)
Platelets: 168 10*3/uL (ref 150–400)
RBC: 3.09 MIL/uL — ABNORMAL LOW (ref 4.22–5.81)
RDW: 15.9 % — ABNORMAL HIGH (ref 11.5–15.5)
WBC: 13.3 10*3/uL — ABNORMAL HIGH (ref 4.0–10.5)
nRBC: 0 % (ref 0.0–0.2)

## 2020-04-22 NOTE — Consult Note (Signed)
Referring Physician: A. Hijazi  Shad Andaya is an 60 y.o. male.                       Chief Complaint: Episodes of bradycardia  HPI: 60 years old male with PMH of acute on chronic respiratory failure, COPD, CKD IV, Sleep apnea with CPAP use, paroxysmal atrial fibrillation, nonsustained VT treated with amiodarone and carvedilol and CHF has episodes of bradycardia. Most of the bradycardic events are when patient is sleeping. His echocardiogram showed preserved LV systolic function with mild RV dilatation and systolic dysfunction. His renal function is gradually improving. He is undergoing hemodialysis at the time of examination.  Past Medical History:  Diagnosis Date  . Acute on chronic respiratory failure with hypoxia ([place])   . Chronic heart failure with preserved ejection fraction ([place])   . Chronic kidney disease, stage IV (severe) ([place])   . COPD, severe ([place])   . Paroxysmal atrial fibrillation ([facility])      Allergies  Allergen Reactions  . Codeine Shortness Of Breath  "SHUTS DOWN MY LUNGS"  . Morphine Shortness Of Breath  "SHUTS DOWN MY LUNGS"  . Unknown [Other] Anaphylaxis  Heavy narcotics  . Bactrim [Sulfamethoxazole-Trimethoprim] Rash   Home Meds (Not in a hospital admission)  Scheduled Meds  Continuous Infusions:   MEDICAL, SURGICAL, FAMILY AND SOCIAL HISTORY:   Past Medical History:  Diagnosis Date  . Arthritis  . Congestive heart failure (*)  . Elevated troponin 12/27/2019  . Fibromyalgia  . Hypertension  . Severe sepsis with septic shock (*) 12/27/2019  . Sleep apnea  CO2 retention  . Tachycardia 12/27/2019   Past Surgical History:  Procedure Laterality Date  . Cholecystectomy 2000  . Gastric fundoplication [id number]  . Hernia repair 2013  . Small intestine surgery 6 years ago   Family History  Problem Relation Age of Onset  . Arthritis Sister  . Hypertension Sister  . Hypertension Son  . Migraines Maternal Grandmother   Social History    Socioeconomic History  . Marital status: Married  Spouse name: None  . Number of children: None  . Years of education: None  . Highest education level: None  Occupational History  Comment: DISABLED  Tobacco Use  . Smoking status: Former Smoker  Packs/day: 0.50  Years: 1.00  Pack years: 0.50  Start date: 53  Quit date: 1978  Years since quitting: 44.2  . Smokeless tobacco: Former Systems developer  . Tobacco comment: smoked 6 months  Vaping Use  . Vaping Use: Never used    No medications prior to admission.  See medication list on chart  Results for orders placed or performed during the hospital encounter of 04/04/20 (from the past 48 hour(s))  CBC     Status: Abnormal   Collection Time: 04/21/20  4:54 AM  Result Value Ref Range   WBC 14.0 (H) 4.0 - 10.5 K/uL   RBC 3.20 (L) 4.22 - 5.81 MIL/uL   Hemoglobin 10.0 (L) 13.0 - 17.0 g/dL   HCT 32.5 (L) 39.0 - 52.0 %   MCV 101.6 (H) 80.0 - 100.0 fL   MCH 31.3 26.0 - 34.0 pg   MCHC 30.8 30.0 - 36.0 g/dL   RDW 15.6 (H) 11.5 - 15.5 %   Platelets 211 150 - 400 K/uL   nRBC 0.0 0.0 - 0.2 %    Comment: Performed at [facility], [address], [place], [place] [id number]  Basic metabolic panel  Status: Abnormal   Collection Time: 04/21/20  4:54 AM  Result Value Ref Range   Sodium 138 135 - 145 mmol/L   Potassium 2.9 (L) 3.5 - 5.1 mmol/L   Chloride 109 98 - 111 mmol/L   CO2 22 22 - 32 mmol/L   Glucose, Bld 113 (H) 70 - 99 mg/dL    Comment: Glucose reference range applies only to samples taken after fasting for at least 8 hours.   BUN 122 (H) 8 - 23 mg/dL   Creatinine, Ser 2.28 (H) 0.61 - 1.24 mg/dL   Calcium 8.7 (L) 8.9 - 10.3 mg/dL   GFR, Estimated 32 (L) >60 mL/min    Comment: (NOTE) Calculated using the CKD-EPI Creatinine Equation (2021)    Anion gap 7 5 - 15    Comment: Performed at [address] [place], [place] [address]  Vancomycin, trough     Status: Abnormal   Collection Time: 04/21/20  2:39  PM  Result Value Ref Range   Vancomycin Tr 21 (HH) 15 - 20 ug/mL    Comment: CRITICAL RESULT CALLED TO, READ BACK BY AND VERIFIED WITH: S,JEFFERS,RN '@[id number]'$  04/21/2020 VANG.J Performed at [facility], [place] [address], [place], [place] [address]   CBC     Status: Abnormal   Collection Time: 04/22/20  2:49 AM  Result Value Ref Range   WBC 13.3 (H) 4.0 - 10.5 K/uL   RBC 3.09 (L) 4.22 - 5.81 MIL/uL   Hemoglobin 9.5 (L) 13.0 - 17.0 g/dL   HCT 31.1 (L) 39.0 - 52.0 %   MCV 100.6 (H) 80.0 - 100.0 fL   MCH 30.7 26.0 - 34.0 pg   MCHC 30.5 30.0 - 36.0 g/dL   RDW 15.9 (H) 11.5 - 15.5 %   Platelets 168 150 - 400 K/uL   nRBC 0.0 0.0 - 0.2 %    Comment: Performed at [facility], [place] [address] [place], [place] [id number]  Basic metabolic panel     Status: Abnormal   Collection Time: 04/22/20  2:49 AM  Result Value Ref Range   Sodium 145 135 - 145 mmol/L   Potassium 3.3 (L) 3.5 - 5.1 mmol/L   Chloride 115 (H) 98 - 111 mmol/L   CO2 22 22 - 32 mmol/L   Glucose, Bld 125 (H) 70 - 99 mg/dL    Comment: Glucose reference range applies only to samples taken after fasting for at least 8 hours.   BUN 127 (H) 8 - 23 mg/dL   Creatinine, Ser 2.45 (H) 0.61 - 1.24 mg/dL   Calcium 8.8 (L) 8.9 - 10.3 mg/dL   GFR, Estimated 29 (L) >60 mL/min    Comment: (NOTE) Calculated using the CKD-EPI Creatinine Equation (2021)    Anion gap 8 5 - 15    Comment: Performed at [place] [address], [place], [place] [address]   No results found.  Review Of Systems As per HPI.  P: 77, R: 24, BP: 130/80, O2 sat 100 % on 28 % FiO2 and spontaneous breathing. There were no vitals taken for this visit. There is no height or weight on file to calculate BMI. General appearance: alert, cooperative, appears stated age and moderate respiratory distress Head: Normocephalic, atraumatic. Eyes: Blue eyes, pink conjunctiva, corneas clear. PERRL, EOM's intact. Neck: No adenopathy, no carotid bruit, no JVD,  supple, symmetrical, tracheostomy present. Resp: Rhonchi to auscultation bilaterally. Cardio: Regular rate and rhythm, S1, S2 normal, II/VI  systolic murmur, no click, rub or gallop GI: Soft, non-tender; bowel sounds normal; no organomegaly. Extremities: No edema, cyanosis or clubbing. Skin: Warm and dry.  Neurologic: Alert and oriented X 1.  Assessment/Plan Sinus bradycardia, asymptomatic Acute on chronic respiratory failure with hypoxia CKD IIIb HTN COPD Paroxysmal atrial fibrillation H/O non-sustained VT S/P tracheostomy  Decrease B-blocker use by 50 % and add small dose hydralazine to improve heart rate.  Time spent: Review of old records, Lab, x-rays, EKG, other cardiac tests, examination, discussion with patient/Nurse/Doctor/Tech over 70 minutes.  Birdie Riddle, MD  04/22/2020, 5:17 PM

## 2020-04-23 LAB — CULTURE, BLOOD (ROUTINE X 2)
Culture: NO GROWTH
Culture: NO GROWTH
Special Requests: ADEQUATE

## 2020-04-23 LAB — POTASSIUM: Potassium: 3.5 mmol/L (ref 3.5–5.1)

## 2020-04-23 NOTE — Consult Note (Signed)
Ref: Merton Border, MD   Subjective:  Awake. HR in 70's. Episodes of sinus bradycardia in 30's.  Objective:  Vital Signs in the last 24 hours:  P: 71, R: 30, BP: 126/62, O2 sat 100 % on 28 % oxygen and spontaneous breathing.  Physical Exam: BP Readings from Last 1 Encounters:  No data found for BP     Wt Readings from Last 1 Encounters:  No data found for Wt    Weight change:  There is no height or weight on file to calculate BMI. HEENT: [place]/AT, Eyes-Blue, Conjunctiva-Pale pink, Sclera-Non-icteric Neck: No JVD, No bruit, Tracheostomy tube in place. Lungs:  Clearing, Bilateral. Cardiac:  Regular rhythm, normal S1 and S2, no S3. II/VI systolic murmur. Abdomen:  Soft, non-tender. BS present. Extremities:  No edema present. No cyanosis. No clubbing. CNS: AxOx1, Cranial nerves grossly intact, moves all 4 extremities.  Skin: Warm and dry.   Intake/Output from previous day: No intake/output data recorded.    Lab Results: BMET    Component Value Date/Time   NA 145 04/22/2020 0249   NA 138 04/21/2020 0454   NA 143 04/19/2020 1421   K 3.5 04/23/2020 1124   K 3.3 (L) 04/22/2020 0249   K 2.9 (L) 04/21/2020 0454   CL 115 (H) 04/22/2020 0249   CL 109 04/21/2020 0454   CL 109 04/19/2020 1421   CO2 22 04/22/2020 0249   CO2 22 04/21/2020 0454   CO2 19 (L) 04/19/2020 1421   GLUCOSE 125 (H) 04/22/2020 0249   GLUCOSE 113 (H) 04/21/2020 0454   GLUCOSE 125 (H) 04/19/2020 1421   BUN 127 (H) 04/22/2020 0249   BUN 122 (H) 04/21/2020 0454   BUN 197 (H) 04/19/2020 1421   CREATININE 2.45 (H) 04/22/2020 0249   CREATININE 2.28 (H) 04/21/2020 0454   CREATININE 3.21 (H) 04/19/2020 1421   CALCIUM 8.8 (L) 04/22/2020 0249   CALCIUM 8.7 (L) 04/21/2020 0454   CALCIUM 9.3 04/19/2020 1421   GFRNONAA 29 (L) 04/22/2020 0249   GFRNONAA 32 (L) 04/21/2020 0454   GFRNONAA 21 (L) 04/19/2020 1421   CBC    Component Value Date/Time   WBC 13.3 (H) 04/22/2020 0249   RBC 3.09 (L) 04/22/2020 0249   HGB  9.5 (L) 04/22/2020 0249   HCT 31.1 (L) 04/22/2020 0249   PLT 168 04/22/2020 0249   MCV 100.6 (H) 04/22/2020 0249   MCH 30.7 04/22/2020 0249   MCHC 30.5 04/22/2020 0249   RDW 15.9 (H) 04/22/2020 0249   LYMPHSABS 1.8 04/04/2020 2004   MONOABS 1.4 (H) 04/04/2020 2004   EOSABS 0.5 04/04/2020 2004   BASOSABS 0.2 (H) 04/04/2020 2004   HEPATIC Function Panel No results for input(s): PROT in the last 8760 hours.  Invalid input(s):  ALBUMIN,  AST,  ALT,  ALKPHOS,  BILIDIR,  IBILI HEMOGLOBIN A1C No components found for: HGA1C,  MPG CARDIAC ENZYMES No results found for: CKTOTAL, CKMB, CKMBINDEX, TROPONINI BNP No results for input(s): PROBNP in the last 8760 hours. TSH No results for input(s): TSH in the last 8760 hours. CHOLESTEROL No results for input(s): CHOL in the last 8760 hours.  Scheduled Meds: Continuous Infusions: PRN Meds:.lidocaine (PF)  Assessment/Plan: Sinus bradycardia, asymptomatic Acute on chronic respiratory failure with hyoxia CKD, IIIb HTN COPD Paroxysmal atrial fibrillation H/O non-sustained VT S/P Tracheostomy  Decrease B-blocker dose further.   LOS: 0 days   Time spent including chart review, lab review, examination, discussion with patient/Nurse/Tach : 30 min   Dixie Dials  MD  04/23/2020, 3:35 PM

## 2020-04-24 DIAGNOSIS — I5032 Chronic diastolic (congestive) heart failure: Secondary | ICD-10-CM | POA: Diagnosis not present

## 2020-04-24 DIAGNOSIS — N184 Chronic kidney disease, stage 4 (severe): Secondary | ICD-10-CM | POA: Diagnosis not present

## 2020-04-24 DIAGNOSIS — J449 Chronic obstructive pulmonary disease, unspecified: Secondary | ICD-10-CM | POA: Diagnosis not present

## 2020-04-24 DIAGNOSIS — J9621 Acute and chronic respiratory failure with hypoxia: Secondary | ICD-10-CM | POA: Diagnosis not present

## 2020-04-24 LAB — TSH: TSH: 1.693 u[IU]/mL (ref 0.350–4.500)

## 2020-04-24 LAB — T4, FREE: Free T4: 0.9 ng/dL (ref 0.61–1.12)

## 2020-04-24 NOTE — Progress Notes (Signed)
Pulmonary Critical Care Medicine [place]  PROGRESS NOTE     Antonio Chavez  [id number]  DOB: 07-01-1960   DOA: 04/04/2020  Referring Physician: Merton Border, MD  HPI: Antonio Chavez is a 59 y.o. male seen for follow up of Acute on Chronic Respiratory Failure.  Patient currently is afebrile without distress at this time seems to be comfortable  Medications: Reviewed on Rounds  Physical Exam:  Vitals: Temperature is 97.9 pulse 90 respiratory rate 20 blood pressure is 133/79 saturations 99%  Ventilator Settings on assist control FiO2 28% tidal volume 500 PEEP 5  . General: Comfortable at this time . Eyes: Grossly normal lids, irises & conjunctiva . ENT: grossly tongue is normal . Neck: no obvious mass . Cardiovascular: S1 S2 normal no gallop . Respiratory: No rhonchi no rales are noted at this time . Abdomen: soft . Skin: no rash seen on limited exam . Musculoskeletal: not rigid . Psychiatric:unable to assess . Neurologic: no seizure no involuntary movements         Lab Data:   Basic Metabolic Panel: Recent Labs  Lab 04/18/20 0618 04/19/20 1421 04/21/20 0454 04/22/20 0249 04/23/20 1124  NA 144 143 138 145  --   K 3.9 3.1* 2.9* 3.3* 3.5  CL 110 109 109 115*  --   CO2 22 19* 22 22  --   GLUCOSE 162* 125* 113* 125*  --   BUN 164* 197* 122* 127*  --   CREATININE 2.98* 3.21* 2.28* 2.45*  --   CALCIUM 9.6 9.3 8.7* 8.8*  --     ABG: Recent Labs  Lab 04/18/20 1053 04/18/20 1244  PHART 7.124* 7.256*  PCO2ART 71.8* 46.3  PO2ART 58.9* 85.5  HCO3 22.7 19.1*  O2SAT 87.3 97.1    Liver Function Tests: No results for input(s): AST, ALT, ALKPHOS, BILITOT, PROT, ALBUMIN in the last 168 hours. No results for input(s): LIPASE, AMYLASE in the last 168 hours. No results for input(s): AMMONIA in the last 168 hours.  CBC: Recent Labs  Lab 04/18/20 0618 04/19/20 1421 04/21/20 0454 04/22/20 0249  WBC 23.6*  15.8* 14.0* 13.3*  HGB 11.2* 10.0* 10.0* 9.5*  HCT 37.8* 33.0* 32.5* 31.1*  MCV 104.4* 103.4* 101.6* 100.6*  PLT 322 240 211 168    Cardiac Enzymes: No results for input(s): CKTOTAL, CKMB, CKMBINDEX, TROPONINI in the last 168 hours.  BNP (last 3 results) No results for input(s): BNP in the last 8760 hours.  ProBNP (last 3 results) No results for input(s): PROBNP in the last 8760 hours.  Radiological Exams: No results found.  Assessment/Plan Active Problems:   Acute on chronic respiratory failure with hypoxia ([facility])   Chronic kidney disease, stage IV (severe) ([facility])   Paroxysmal atrial fibrillation ([facility])   COPD, severe ([facility])   Chronic heart failure with preserved ejection fraction ([place])   1. Acute on chronic respiratory failure with hypoxia we will continue with trying to wean patient apparently did 8 hours on pressure support so today we should try for 12 hours 2. Chronic kidney disease stage IV supportive care 3. Paroxysmal atrial fibrillation rate is controlled 4. Severe COPD we will continue with medical management 5. Chronic heart failure preserved ejection fraction no change   I have personally seen and evaluated the patient, evaluated laboratory and imaging results, formulated the assessment and plan and placed orders. The Patient requires high complexity decision making with multiple systems involvement.  Rounds were done with the  Respiratory Therapy Director and Staff therapists and discussed with nursing staff also.  Allyne Gee, MD [facility] Pulmonary Critical Care Medicine Sleep Medicine

## 2020-04-25 DIAGNOSIS — J449 Chronic obstructive pulmonary disease, unspecified: Secondary | ICD-10-CM | POA: Diagnosis not present

## 2020-04-25 DIAGNOSIS — N184 Chronic kidney disease, stage 4 (severe): Secondary | ICD-10-CM | POA: Diagnosis not present

## 2020-04-25 DIAGNOSIS — I5032 Chronic diastolic (congestive) heart failure: Secondary | ICD-10-CM | POA: Diagnosis not present

## 2020-04-25 DIAGNOSIS — J9621 Acute and chronic respiratory failure with hypoxia: Secondary | ICD-10-CM | POA: Diagnosis not present

## 2020-04-25 LAB — RENAL FUNCTION PANEL
Albumin: 2.1 g/dL — ABNORMAL LOW (ref 3.5–5.0)
Anion gap: 6 (ref 5–15)
BUN: 103 mg/dL — ABNORMAL HIGH (ref 8–23)
CO2: 24 mmol/L (ref 22–32)
Calcium: 8.8 mg/dL — ABNORMAL LOW (ref 8.9–10.3)
Chloride: 108 mmol/L (ref 98–111)
Creatinine, Ser: 2.17 mg/dL — ABNORMAL HIGH (ref 0.61–1.24)
GFR, Estimated: 34 mL/min — ABNORMAL LOW (ref >60)
Glucose, Bld: 110 mg/dL — ABNORMAL HIGH (ref 70–99)
Phosphorus: 3.1 mg/dL (ref 2.5–4.6)
Potassium: 3.4 mmol/L — ABNORMAL LOW (ref 3.5–5.1)
Sodium: 138 mmol/L (ref 135–145)

## 2020-04-25 LAB — CBC
HCT: 30.1 % — ABNORMAL LOW (ref 39.0–52.0)
Hemoglobin: 9.2 g/dL — ABNORMAL LOW (ref 13.0–17.0)
MCH: 31 pg (ref 26.0–34.0)
MCHC: 30.6 g/dL (ref 30.0–36.0)
MCV: 101.3 fL — ABNORMAL HIGH (ref 80.0–100.0)
Platelets: 178 10*3/uL (ref 150–400)
RBC: 2.97 MIL/uL — ABNORMAL LOW (ref 4.22–5.81)
RDW: 17.2 % — ABNORMAL HIGH (ref 11.5–15.5)
WBC: 15.2 10*3/uL — ABNORMAL HIGH (ref 4.0–10.5)
nRBC: 0 % (ref 0.0–0.2)

## 2020-04-25 LAB — VANCOMYCIN, TROUGH: Vancomycin Tr: 22 ug/mL (ref 15–20)

## 2020-04-25 NOTE — Progress Notes (Signed)
[facility]  ROUNDING NOTE   Subjective:  Patient remains critically ill. Still on the ventilator. FiO2 currently 28%. Underwent dialysis today.   Objective:  Vital signs in last 24 hours:  Temperature 97.1 pulse 48 respirations 18 blood pressure 150/74  Physical Exam: General:  Critically ill-appearing  Head:  Normocephalic, atraumatic. Moist oral mucosal membranes  Eyes:  Anicteric  Neck:  Tracheostomy in place  Lungs:   Coarse breath sounds bilateral, vent assisted  Heart:  S1S2 no rubs  Abdomen:   Soft, nontender, bowel sounds present  Extremities:  Trace peripheral edema.  Neurologic:  Awake, alert  Skin:  Bilateral upper extremity ecchymoses  Access:  Right IJ temporary dialysis catheter    Basic Metabolic Panel: Recent Labs  Lab 04/19/20 1421 04/21/20 0454 04/22/20 0249 04/23/20 1124 04/25/20 0616  NA 143 138 145  --  138  K 3.1* 2.9* 3.3* 3.5 3.4*  CL 109 109 115*  --  108  CO2 19* 22 22  --  24  GLUCOSE 125* 113* 125*  --  110*  BUN 197* 122* 127*  --  103*  CREATININE 3.21* 2.28* 2.45*  --  2.17*  CALCIUM 9.3 8.7* 8.8*  --  8.8*  PHOS  --   --   --   --  3.1    Liver Function Tests: Recent Labs  Lab 04/25/20 0616  ALBUMIN 2.1*   No results for input(s): LIPASE, AMYLASE in the last 168 hours. No results for input(s): AMMONIA in the last 168 hours.  CBC: Recent Labs  Lab 04/19/20 1421 04/21/20 0454 04/22/20 0249 04/25/20 0657  WBC 15.8* 14.0* 13.3* 15.2*  HGB 10.0* 10.0* 9.5* 9.2*  HCT 33.0* 32.5* 31.1* 30.1*  MCV 103.4* 101.6* 100.6* 101.3*  PLT 240 211 168 178    Cardiac Enzymes: No results for input(s): CKTOTAL, CKMB, CKMBINDEX, TROPONINI in the last 168 hours.  BNP: Invalid input(s): POCBNP  CBG: No results for input(s): GLUCAP in the last 168 hours.  Microbiology: Results for orders placed or performed during the hospital encounter of 04/04/20  Culture, Respiratory w Gram Stain     Status: None   Collection  Time: 04/05/20 12:23 PM   Specimen: Tracheal Aspirate; Respiratory  Result Value Ref Range Status   Specimen Description TRACHEAL ASPIRATE  Final   Special Requests NONE  Final   Gram Stain   Final    FEW WBC PRESENT, PREDOMINANTLY PMN ABUNDANT GRAM NEGATIVE RODS FEW GRAM POSITIVE COCCI IN PAIRS IN CLUSTERS Performed at [facility], [address], [place], [place] [address]    Culture ABUNDANT PSEUDOMONAS AERUGINOSA  Final   Report Status 04/07/2020 FINAL  Final   Organism ID, Bacteria PSEUDOMONAS AERUGINOSA  Final      Susceptibility   Pseudomonas aeruginosa - MIC*    CEFTAZIDIME 8 SENSITIVE Sensitive     CIPROFLOXACIN 0.5 SENSITIVE Sensitive     GENTAMICIN <=1 SENSITIVE Sensitive     IMIPENEM <=0.25 SENSITIVE Sensitive     * ABUNDANT PSEUDOMONAS AERUGINOSA  Culture, blood (routine x 2)     Status: None   Collection Time: 04/05/20 12:41 PM   Specimen: BLOOD LEFT HAND  Result Value Ref Range Status   Specimen Description BLOOD LEFT HAND  Final   Special Requests   Final    BOTTLES DRAWN AEROBIC AND ANAEROBIC Blood Culture adequate volume   Culture   Final    NO GROWTH 6 DAYS Performed at [facility], [address] [place], [place] [address]    Report Status 04/11/2020 FINAL  Final  Culture, blood (routine x 2)     Status: None   Collection Time: 04/05/20 12:50 PM   Specimen: BLOOD RIGHT HAND  Result Value Ref Range Status   Specimen Description BLOOD RIGHT HAND  Final   Special Requests   Final    BOTTLES DRAWN AEROBIC AND ANAEROBIC Blood Culture results may not be optimal due to an inadequate volume of blood received in culture bottles   Culture   Final    NO GROWTH 6 DAYS Performed at [facility], [place] [address], [place], [place] [address]    Report Status 04/11/2020 FINAL  Final  Culture, Respiratory w Gram Stain     Status: None   Collection Time: 04/18/20  1:18 PM   Specimen: Tracheal Aspirate; Respiratory  Result Value Ref Range Status    Specimen Description TRACHEAL ASPIRATE  Final   Special Requests NONE  Final   Gram Stain   Final    FEW WBC PRESENT, PREDOMINANTLY PMN ABUNDANT GRAM NEGATIVE RODS RARE GRAM POSITIVE COCCI Performed at [facility], [place] [address], [place], [place] [address]    Culture ABUNDANT PSEUDOMONAS AERUGINOSA  Final   Report Status 04/21/2020 FINAL  Final   Organism ID, Bacteria PSEUDOMONAS AERUGINOSA  Final      Susceptibility   Pseudomonas aeruginosa - MIC*    CEFTAZIDIME 8 SENSITIVE Sensitive     CIPROFLOXACIN 1 SENSITIVE Sensitive     GENTAMICIN 4 SENSITIVE Sensitive     IMIPENEM <=0.25 SENSITIVE Sensitive     * ABUNDANT PSEUDOMONAS AERUGINOSA  Culture, Urine     Status: Abnormal   Collection Time: 04/18/20  1:19 PM   Specimen: Urine, Random  Result Value Ref Range Status   Specimen Description URINE, RANDOM  Final   Special Requests NONE  Final   Culture (A)  Final    <10,000 COLONIES/mL INSIGNIFICANT GROWTH Performed at [facility], [place] [address] [place], [place] [address]    Report Status 04/19/2020 FINAL  Final  Culture, blood (routine x 2)     Status: None   Collection Time: 04/18/20  1:30 PM   Specimen: BLOOD  Result Value Ref Range Status   Specimen Description BLOOD RIGHT ANTECUBITAL  Final   Special Requests   Final    BOTTLES DRAWN AEROBIC ONLY Blood Culture results may not be optimal due to an excessive volume of blood received in culture bottles   Culture   Final    NO GROWTH 5 DAYS Performed at [facility], [place] [address], [place], [place] [address]    Report Status 04/23/2020 FINAL  Final  Culture, blood (routine x 2)     Status: None   Collection Time: 04/18/20  1:43 PM   Specimen: BLOOD  Result Value Ref Range Status   Specimen Description BLOOD BLOOD RIGHT FOREARM  Final   Special Requests   Final    BOTTLES DRAWN AEROBIC AND ANAEROBIC Blood Culture adequate volume   Culture   Final    NO GROWTH 5 DAYS Performed at [facility], [place] [address] [place], [place] [address]    Report Status 04/23/2020 FINAL  Final    Coagulation Studies: No results for input(s): LABPROT, INR in the last 72 hours.  Urinalysis: No results for input(s): COLORURINE, LABSPEC, PHURINE, GLUCOSEU, HGBUR, BILIRUBINUR, KETONESUR, PROTEINUR, UROBILINOGEN, NITRITE, LEUKOCYTESUR in the last 72  hours.  Invalid input(s): APPERANCEUR    Imaging: No results found.   Medications:       Assessment/ Plan:  60 y.o. male with a PMHx of acute on chronic respiratory failure status post tracheostomy placement, paroxysmal atrial fibrillation, acute diastolic heart failure, chronic kidney disease stage IV, nonsustained ventricular tachycardia, obstructive sleep apnea, COPD, hypertension, history of status epilepticus, who was admitted to [facility] on 04/04/2020 for ongoing care.  1.  Acute kidney injury/chronic kidney disease stage IV.  When the patient left outside hospital his EGFR was 49.   -Patient still with significant azotemia.  He does have reasonable urine output of 1.1 L.  We will plan for dialysis treatment again on Thursday.  2.  Acute respiratory failure.  Patient maintained on ventilatory support.  FiO2 currently 28%.  Weaning as per pulmonary/critical care.  3.  Anemia of chronic kidney disease.  Hemoglobin 9.2.  Hold off on Epogen for now.  4.  Hypokalemia.  Potassium up to 3.4 post repletion.  We will continue to monitor serum potassium.   LOS: 0 Analucia Hush 4/19/20222:18 PM

## 2020-04-25 NOTE — Progress Notes (Signed)
Pulmonary Critical Care Medicine [place]  PROGRESS NOTE     Antonio Chavez  [id number]  DOB: 1960-03-25   DOA: 04/04/2020  Referring Physician: Merton Border, MD  HPI: Antonio Chavez is a 60 y.o. male seen for follow up of Acute on Chronic Respiratory Failure.  Patient currently is on pressure support has been on 28% FiO2 with a pressure of 12/5  Medications: Reviewed on Rounds  Physical Exam:  Vitals: Temperature is 97.1 pulse 48 respiratory 18 blood pressure is 150/74 saturations 100%  Ventilator Settings on pressure support 12/5  . General: Comfortable at this time . Eyes: Grossly normal lids, irises & conjunctiva . ENT: grossly tongue is normal . Neck: no obvious mass . Cardiovascular: S1 S2 normal no gallop . Respiratory: No rhonchi no rales noted at this time . Abdomen: soft . Skin: no rash seen on limited exam . Musculoskeletal: not rigid . Psychiatric:unable to assess . Neurologic: no seizure no involuntary movements         Lab Data:   Basic Metabolic Panel: Recent Labs  Lab 04/19/20 1421 04/21/20 0454 04/22/20 0249 04/23/20 1124 04/25/20 0616  NA 143 138 145  --  138  K 3.1* 2.9* 3.3* 3.5 3.4*  CL 109 109 115*  --  108  CO2 19* 22 22  --  24  GLUCOSE 125* 113* 125*  --  110*  BUN 197* 122* 127*  --  103*  CREATININE 3.21* 2.28* 2.45*  --  2.17*  CALCIUM 9.3 8.7* 8.8*  --  8.8*  PHOS  --   --   --   --  3.1    ABG: Recent Labs  Lab 04/18/20 1244  PHART 7.256*  PCO2ART 46.3  PO2ART 85.5  HCO3 19.1*  O2SAT 97.1    Liver Function Tests: Recent Labs  Lab 04/25/20 0616  ALBUMIN 2.1*   No results for input(s): LIPASE, AMYLASE in the last 168 hours. No results for input(s): AMMONIA in the last 168 hours.  CBC: Recent Labs  Lab 04/19/20 1421 04/21/20 0454 04/22/20 0249 04/25/20 0657  WBC 15.8* 14.0* 13.3* 15.2*  HGB 10.0* 10.0* 9.5* 9.2*  HCT 33.0* 32.5* 31.1* 30.1*  MCV  103.4* 101.6* 100.6* 101.3*  PLT 240 211 168 178    Cardiac Enzymes: No results for input(s): CKTOTAL, CKMB, CKMBINDEX, TROPONINI in the last 168 hours.  BNP (last 3 results) No results for input(s): BNP in the last 8760 hours.  ProBNP (last 3 results) No results for input(s): PROBNP in the last 8760 hours.  Radiological Exams: No results found.  Assessment/Plan Active Problems:   Acute on chronic respiratory failure with hypoxia (HCC)   Chronic kidney disease, stage IV (severe) (HCC)   Paroxysmal atrial fibrillation (HCC)   COPD, severe (HCC)   Chronic heart failure with preserved ejection fraction ([place])   1. Acute on chronic respiratory failure with hypoxia we will continue with pressure support wean as tolerated goal of 16 hours. 2. Chronic kidney disease stage IV supportive care 3. Paroxysmal atrial fibrillation rate is controlled 4. Severe COPD medical management 5. Chronic heart failure preserved ejection fraction   I have personally seen and evaluated the patient, evaluated laboratory and imaging results, formulated the assessment and plan and placed orders. The Patient requires high complexity decision making with multiple systems involvement.  Rounds were done with the Respiratory Therapy Director and Staff therapists and discussed with nursing staff also.  Allyne Gee, MD  [facility] Pulmonary Critical Care Medicine Sleep Medicine

## 2020-04-26 DIAGNOSIS — J9621 Acute and chronic respiratory failure with hypoxia: Secondary | ICD-10-CM | POA: Diagnosis not present

## 2020-04-26 DIAGNOSIS — N184 Chronic kidney disease, stage 4 (severe): Secondary | ICD-10-CM | POA: Diagnosis not present

## 2020-04-26 DIAGNOSIS — I5032 Chronic diastolic (congestive) heart failure: Secondary | ICD-10-CM | POA: Diagnosis not present

## 2020-04-26 DIAGNOSIS — J449 Chronic obstructive pulmonary disease, unspecified: Secondary | ICD-10-CM | POA: Diagnosis not present

## 2020-04-26 LAB — VANCOMYCIN, TROUGH: Vancomycin Tr: 26 ug/mL (ref 15–20)

## 2020-04-26 NOTE — Progress Notes (Signed)
Pulmonary Critical Care Medicine [place]  PROGRESS NOTE     Antonio Chavez  [id number]  DOB: [birth year]   DOA: 04/04/2020  Referring Physician: Merton Border, MD  HPI: Antonio Chavez is a 60 y.o. male seen for follow up of Acute on Chronic Respiratory Failure.  Patient right now is afebrile without distress comfortable remains on T collar  Medications: Reviewed on Rounds  Physical Exam:  Vitals: Temperature is 98.6 pulse 65 respiratory rate is 22 blood pressure is 146/80 saturations 100%  Ventilator Settings on T collar with an FiO2 28%  . General: Comfortable at this time . Eyes: Grossly normal lids, irises & conjunctiva . ENT: grossly tongue is normal . Neck: no obvious mass . Cardiovascular: S1 S2 normal no gallop . Respiratory: No rhonchi no rales are noted at this time . Abdomen: soft . Skin: no rash seen on limited exam . Musculoskeletal: not rigid . Psychiatric:unable to assess . Neurologic: no seizure no involuntary movements         Lab Data:   Basic Metabolic Panel: Recent Labs  Lab 04/19/20 1421 04/21/20 0454 04/22/20 0249 04/23/20 1124 04/25/20 0616  NA 143 138 145  --  138  K 3.1* 2.9* 3.3* 3.5 3.4*  CL 109 109 115*  --  108  CO2 19* 22 22  --  24  GLUCOSE 125* 113* 125*  --  110*  BUN 197* 122* 127*  --  103*  CREATININE 3.21* 2.28* 2.45*  --  2.17*  CALCIUM 9.3 8.7* 8.8*  --  8.8*  PHOS  --   --   --   --  3.1    ABG: No results for input(s): PHART, PCO2ART, PO2ART, HCO3, O2SAT in the last 168 hours.  Liver Function Tests: Recent Labs  Lab 04/25/20 0616  ALBUMIN 2.1*   No results for input(s): LIPASE, AMYLASE in the last 168 hours. No results for input(s): AMMONIA in the last 168 hours.  CBC: Recent Labs  Lab 04/19/20 1421 04/21/20 0454 04/22/20 0249 04/25/20 0657  WBC 15.8* 14.0* 13.3* 15.2*  HGB 10.0* 10.0* 9.5* 9.2*  HCT 33.0* 32.5* 31.1* 30.1*  MCV 103.4* 101.6*  100.6* 101.3*  PLT 240 211 168 178    Cardiac Enzymes: No results for input(s): CKTOTAL, CKMB, CKMBINDEX, TROPONINI in the last 168 hours.  BNP (last 3 results) No results for input(s): BNP in the last 8760 hours.  ProBNP (last 3 results) No results for input(s): PROBNP in the last 8760 hours.  Radiological Exams: No results found.  Assessment/Plan Active Problems:   Acute on chronic respiratory failure with hypoxia (HCC)   Chronic kidney disease, stage IV (severe) (HCC)   Paroxysmal atrial fibrillation (HCC)   COPD, severe (HCC)   Chronic heart failure with preserved ejection fraction ([place])   1. Acute on chronic respiratory failure with hypoxia plan is going to be to continue with T collar the goal today is for 2 hours. 2. Chronic kidney disease stage IV supportive care we will continue to monitor 3. Paroxysmal atrial fibrillation rate is controlled 4. Severe COPD medical management 5. Chronic heart failure preserved ejection fraction   I have personally seen and evaluated the patient, evaluated laboratory and imaging results, formulated the assessment and plan and placed orders. The Patient requires high complexity decision making with multiple systems involvement.  Rounds were done with the Respiratory Therapy Director and Staff therapists and discussed with nursing staff also.  Emmajane Altamura A  Humphrey Rolls, MD [facility] Pulmonary Critical Care Medicine Sleep Medicine

## 2020-04-26 NOTE — Progress Notes (Signed)
[facility]  ROUNDING NOTE   Subjective:  Patient moved over to room 13. Due for dialysis again tomorrow. Currently off ventilator.   Objective:  Vital signs in last 24 hours:  Temperature 98.6 pulse 85 respirations 20 blood pressure 146/70 urine output 875 cc  Physical Exam: General:  Critically ill-appearing  Head:  Normocephalic, atraumatic. Moist oral mucosal membranes  Eyes:  Anicteric  Neck:  Tracheostomy in place  Lungs:   Coarse breath sounds bilateral, normal effort  Heart:  S1S2 no rubs  Abdomen:   Soft, nontender, bowel sounds present  Extremities:  Trace peripheral edema.  Neurologic:  Awake, alert  Skin:  Bilateral upper extremity ecchymoses  Access:  Right IJ temporary dialysis catheter    Basic Metabolic Panel: Recent Labs  Lab 04/19/20 1421 04/21/20 0454 04/22/20 0249 04/23/20 1124 04/25/20 0616  NA 143 138 145  --  138  K 3.1* 2.9* 3.3* 3.5 3.4*  CL 109 109 115*  --  108  CO2 19* 22 22  --  24  GLUCOSE 125* 113* 125*  --  110*  BUN 197* 122* 127*  --  103*  CREATININE 3.21* 2.28* 2.45*  --  2.17*  CALCIUM 9.3 8.7* 8.8*  --  8.8*  PHOS  --   --   --   --  3.1    Liver Function Tests: Recent Labs  Lab 04/25/20 0616  ALBUMIN 2.1*   No results for input(s): LIPASE, AMYLASE in the last 168 hours. No results for input(s): AMMONIA in the last 168 hours.  CBC: Recent Labs  Lab 04/19/20 1421 04/21/20 0454 04/22/20 0249 04/25/20 0657  WBC 15.8* 14.0* 13.3* 15.2*  HGB 10.0* 10.0* 9.5* 9.2*  HCT 33.0* 32.5* 31.1* 30.1*  MCV 103.4* 101.6* 100.6* 101.3*  PLT 240 211 168 178    Cardiac Enzymes: No results for input(s): CKTOTAL, CKMB, CKMBINDEX, TROPONINI in the last 168 hours.  BNP: Invalid input(s): POCBNP  CBG: No results for input(s): GLUCAP in the last 168 hours.  Microbiology: Results for orders placed or performed during the hospital encounter of 04/04/20  Culture, Respiratory w Gram Stain     Status: None    Collection Time: 04/05/20 12:23 PM   Specimen: Tracheal Aspirate; Respiratory  Result Value Ref Range Status   Specimen Description TRACHEAL ASPIRATE  Final   Special Requests NONE  Final   Gram Stain   Final    FEW WBC PRESENT, PREDOMINANTLY PMN ABUNDANT GRAM NEGATIVE RODS FEW GRAM POSITIVE COCCI IN PAIRS IN CLUSTERS Performed at [facility], [address], [place],  [address]    Culture ABUNDANT PSEUDOMONAS AERUGINOSA  Final   Report Status 04/07/2020 FINAL  Final   Organism ID, Bacteria PSEUDOMONAS AERUGINOSA  Final      Susceptibility   Pseudomonas aeruginosa - MIC*    CEFTAZIDIME 8 SENSITIVE Sensitive     CIPROFLOXACIN 0.5 SENSITIVE Sensitive     GENTAMICIN <=1 SENSITIVE Sensitive     IMIPENEM <=0.25 SENSITIVE Sensitive     * ABUNDANT PSEUDOMONAS AERUGINOSA  Culture, blood (routine x 2)     Status: None   Collection Time: 04/05/20 12:41 PM   Specimen: BLOOD LEFT HAND  Result Value Ref Range Status   Specimen Description BLOOD LEFT HAND  Final   Special Requests   Final    BOTTLES DRAWN AEROBIC AND ANAEROBIC Blood Culture adequate volume   Culture   Final    NO GROWTH 6 DAYS Performed at [facility], [place] [address] [place], [place] [address]    Report Status 04/11/2020 FINAL  Final  Culture, blood (routine x 2)     Status: None   Collection Time: 04/05/20 12:50 PM   Specimen: BLOOD RIGHT HAND  Result Value Ref Range Status   Specimen Description BLOOD RIGHT HAND  Final   Special Requests   Final    BOTTLES DRAWN AEROBIC AND ANAEROBIC Blood Culture results may not be optimal due to an inadequate volume of blood received in culture bottles   Culture   Final    NO GROWTH 6 DAYS Performed at [facility], [place] [address], [place], [place] [address]    Report Status 04/11/2020 FINAL  Final  Culture, Respiratory w Gram Stain     Status: None   Collection Time: 04/18/20  1:18 PM   Specimen: Tracheal Aspirate; Respiratory  Result Value Ref Range  Status   Specimen Description TRACHEAL ASPIRATE  Final   Special Requests NONE  Final   Gram Stain   Final    FEW WBC PRESENT, PREDOMINANTLY PMN ABUNDANT GRAM NEGATIVE RODS RARE GRAM POSITIVE COCCI Performed at [facility], [place] [address] [place], [place] [address]    Culture ABUNDANT PSEUDOMONAS AERUGINOSA  Final   Report Status 04/21/2020 FINAL  Final   Organism ID, Bacteria PSEUDOMONAS AERUGINOSA  Final      Susceptibility   Pseudomonas aeruginosa - MIC*    CEFTAZIDIME 8 SENSITIVE Sensitive     CIPROFLOXACIN 1 SENSITIVE Sensitive     GENTAMICIN 4 SENSITIVE Sensitive     IMIPENEM <=0.25 SENSITIVE Sensitive     * ABUNDANT PSEUDOMONAS AERUGINOSA  Culture, Urine     Status: Abnormal   Collection Time: 04/18/20  1:19 PM   Specimen: Urine, Random  Result Value Ref Range Status   Specimen Description URINE, RANDOM  Final   Special Requests NONE  Final   Culture (A)  Final    <10,000 COLONIES/mL INSIGNIFICANT GROWTH Performed at [facility], [place] [address], [place], [place] [address]    Report Status 04/19/2020 FINAL  Final  Culture, blood (routine x 2)     Status: None   Collection Time: 04/18/20  1:30 PM   Specimen: BLOOD  Result Value Ref Range Status   Specimen Description BLOOD RIGHT ANTECUBITAL  Final   Special Requests   Final    BOTTLES DRAWN AEROBIC ONLY Blood Culture results may not be optimal due to an excessive volume of blood received in culture bottles   Culture   Final    NO GROWTH 5 DAYS Performed at [facility], [place] [address], [place], [place] [address]    Report Status 04/23/2020 FINAL  Final  Culture, blood (routine x 2)     Status: None   Collection Time: 04/18/20  1:43 PM   Specimen: BLOOD  Result Value Ref Range Status   Specimen Description BLOOD BLOOD RIGHT FOREARM  Final   Special Requests   Final    BOTTLES DRAWN AEROBIC AND ANAEROBIC Blood Culture adequate volume   Culture   Final    NO GROWTH 5 DAYS Performed at [facility], [place] [address] [place], [place] [address]    Report Status 04/23/2020 FINAL  Final    Coagulation Studies: No results for input(s): LABPROT, INR in the last 72 hours.  Urinalysis: No results for input(s): COLORURINE, LABSPEC, [place], [place], [place], [place], [place], [place], [place], NITRITE, LEUKOCYTESUR  in the last 72 hours.  Invalid input(s): APPERANCEUR    Imaging: No results found.   Medications:       Assessment/ Plan:  60 y.o. male with a PMHx of acute on chronic respiratory failure status post tracheostomy placement, paroxysmal atrial fibrillation, acute diastolic heart failure, chronic kidney disease stage IV, nonsustained ventricular tachycardia, obstructive sleep apnea, COPD, hypertension, history of status epilepticus, who was admitted to [facility] on 04/04/2020 for ongoing care.  1.  Acute kidney injury/chronic kidney disease stage IV.  When the patient left outside hospital his EGFR was 49.   -Urine output dropped 875 cc over the patient 24 hours.  Therefore we will maintain the patient on dialysis treatments.  Next dialysis treatment tomorrow.  2.  Acute respiratory failure.  Currently off the ventilator and on aerosolized trach collar.  3.  Anemia of chronic kidney disease.  Repeat hemoglobin tomorrow.  4.  Hypokalemia.  Repeat serum potassium tomorrow.   LOS: 0 Nacole Fluhr 4/20/202210:37 AM

## 2020-04-27 DIAGNOSIS — N184 Chronic kidney disease, stage 4 (severe): Secondary | ICD-10-CM | POA: Diagnosis not present

## 2020-04-27 DIAGNOSIS — I5032 Chronic diastolic (congestive) heart failure: Secondary | ICD-10-CM | POA: Diagnosis not present

## 2020-04-27 DIAGNOSIS — J9621 Acute and chronic respiratory failure with hypoxia: Secondary | ICD-10-CM | POA: Diagnosis not present

## 2020-04-27 DIAGNOSIS — J449 Chronic obstructive pulmonary disease, unspecified: Secondary | ICD-10-CM | POA: Diagnosis not present

## 2020-04-27 LAB — RENAL FUNCTION PANEL
Albumin: 2 g/dL — ABNORMAL LOW (ref 3.5–5.0)
Anion gap: 7 (ref 5–15)
BUN: 81 mg/dL — ABNORMAL HIGH (ref 8–23)
CO2: 25 mmol/L (ref 22–32)
Calcium: 9.1 mg/dL (ref 8.9–10.3)
Chloride: 103 mmol/L (ref 98–111)
Creatinine, Ser: 2.18 mg/dL — ABNORMAL HIGH (ref 0.61–1.24)
GFR, Estimated: 34 mL/min — ABNORMAL LOW (ref >60)
Glucose, Bld: 112 mg/dL — ABNORMAL HIGH (ref 70–99)
Phosphorus: 3 mg/dL (ref 2.5–4.6)
Potassium: 3.4 mmol/L — ABNORMAL LOW (ref 3.5–5.1)
Sodium: 135 mmol/L (ref 135–145)

## 2020-04-27 LAB — CBC
HCT: 31.2 % — ABNORMAL LOW (ref 39.0–52.0)
Hemoglobin: 9.7 g/dL — ABNORMAL LOW (ref 13.0–17.0)
MCH: 31.7 pg (ref 26.0–34.0)
MCHC: 31.1 g/dL (ref 30.0–36.0)
MCV: 102 fL — ABNORMAL HIGH (ref 80.0–100.0)
Platelets: 198 10*3/uL (ref 150–400)
RBC: 3.06 MIL/uL — ABNORMAL LOW (ref 4.22–5.81)
RDW: 17.2 % — ABNORMAL HIGH (ref 11.5–15.5)
WBC: 15.4 10*3/uL — ABNORMAL HIGH (ref 4.0–10.5)
nRBC: 0 % (ref 0.0–0.2)

## 2020-04-27 NOTE — Progress Notes (Signed)
Pulmonary Critical Care Medicine [place]  PROGRESS NOTE     Antonio Chavez  [id number]  DOB: [birth year]   DOA: 04/04/2020  Referring Physician: Merton Border, MD  HPI: Antonio Chavez is a 60 y.o. male seen for follow up of Acute on Chronic Respiratory Failure.  Patient currently is off the ventilator has been afebrile remains on T collar today's goal is for 4 hours  Medications: Reviewed on Rounds  Physical Exam:  Vitals: Temperature is 98.1 pulse 66 respiratory 24 blood pressure is 123/78 saturations 100%  Ventilator Settings on T collar with an FiO2 28%  . General: Comfortable at this time . Eyes: Grossly normal lids, irises & conjunctiva . ENT: grossly tongue is normal . Neck: no obvious mass . Cardiovascular: S1 S2 normal no gallop . Respiratory: No rhonchi no rales are noted at this time. . Abdomen: soft . Skin: no rash seen on limited exam . Musculoskeletal: not rigid . Psychiatric:unable to assess . Neurologic: no seizure no involuntary movements         Lab Data:   Basic Metabolic Panel: Recent Labs  Lab 04/21/20 0454 04/22/20 0249 04/23/20 1124 04/25/20 0616 04/27/20 0508  NA 138 145  --  138 135  K 2.9* 3.3* 3.5 3.4* 3.4*  CL 109 115*  --  108 103  CO2 22 22  --  24 25  GLUCOSE 113* 125*  --  110* 112*  BUN 122* 127*  --  103* 81*  CREATININE 2.28* 2.45*  --  2.17* 2.18*  CALCIUM 8.7* 8.8*  --  8.8* 9.1  PHOS  --   --   --  3.1 3.0    ABG: No results for input(s): PHART, PCO2ART, PO2ART, HCO3, O2SAT in the last 168 hours.  Liver Function Tests: Recent Labs  Lab 04/25/20 0616 04/27/20 0508  ALBUMIN 2.1* 2.0*   No results for input(s): LIPASE, AMYLASE in the last 168 hours. No results for input(s): AMMONIA in the last 168 hours.  CBC: Recent Labs  Lab 04/21/20 0454 04/22/20 0249 04/25/20 0657 04/27/20 0508  WBC 14.0* 13.3* 15.2* 15.4*  HGB 10.0* 9.5* 9.2* 9.7*  HCT 32.5*  31.1* 30.1* 31.2*  MCV 101.6* 100.6* 101.3* 102.0*  PLT 211 168 178 198    Cardiac Enzymes: No results for input(s): CKTOTAL, CKMB, CKMBINDEX, TROPONINI in the last 168 hours.  BNP (last 3 results) No results for input(s): BNP in the last 8760 hours.  ProBNP (last 3 results) No results for input(s): PROBNP in the last 8760 hours.  Radiological Exams: No results found.  Assessment/Plan Active Problems:   Acute on chronic respiratory failure with hypoxia (HCC)   Chronic kidney disease, stage IV (severe) (HCC)   Paroxysmal atrial fibrillation (HCC)   COPD, severe (HCC)   Chronic heart failure with preserved ejection fraction ([place])   1. Acute on chronic respiratory failure with hypoxia we will continue with T collar trials titrate oxygen as tolerated continue pulmonary toilet. 2. Chronic kidney disease stage IV supportive care monitor patient's fluid status. 3. Paroxysmal atrial fibrillation rate is controlled 4. Severe COPD medical management 5. Chronic heart failure preserved ejection fraction no change we will continue to follow along   I have personally seen and evaluated the patient, evaluated laboratory and imaging results, formulated the assessment and plan and placed orders. The Patient requires high complexity decision making with multiple systems involvement.  Rounds were done with the Respiratory Therapy Director and Staff  therapists and discussed with nursing staff also.  Allyne Gee, MD [facility] Pulmonary Critical Care Medicine Sleep Medicine

## 2020-04-28 DIAGNOSIS — I5032 Chronic diastolic (congestive) heart failure: Secondary | ICD-10-CM | POA: Diagnosis not present

## 2020-04-28 DIAGNOSIS — N184 Chronic kidney disease, stage 4 (severe): Secondary | ICD-10-CM | POA: Diagnosis not present

## 2020-04-28 DIAGNOSIS — J9621 Acute and chronic respiratory failure with hypoxia: Secondary | ICD-10-CM | POA: Diagnosis not present

## 2020-04-28 DIAGNOSIS — J449 Chronic obstructive pulmonary disease, unspecified: Secondary | ICD-10-CM | POA: Diagnosis not present

## 2020-04-28 NOTE — Progress Notes (Signed)
[facility]  ROUNDING NOTE   Subjective:  Patient currently on aerosolized trach collar. Resting comfortably in bed at the moment. Due for dialysis again tomorrow.  Objective:  Vital signs in last 24 hours:  Temperature 98 pulse 58 respirations 20 blood pressure 118/53  Physical Exam: General:  Critically ill-appearing  Head:  Normocephalic, atraumatic. Moist oral mucosal membranes  Eyes:  Anicteric  Neck:  Tracheostomy in place  Lungs:   Coarse breath sounds bilateral, normal effort  Heart:  S1S2 no rubs  Abdomen:   Soft, nontender, bowel sounds present  Extremities:  Trace peripheral edema.  Neurologic:  Awake, alert  Skin:  Bilateral upper extremity ecchymoses  Access:  Right IJ temporary dialysis catheter    Basic Metabolic Panel: Recent Labs  Lab 04/22/20 0249 04/23/20 1124 04/25/20 0616 04/27/20 0508  NA 145  --  138 135  K 3.3* 3.5 3.4* 3.4*  CL 115*  --  108 103  CO2 22  --  24 25  GLUCOSE 125*  --  110* 112*  BUN 127*  --  103* 81*  CREATININE 2.45*  --  2.17* 2.18*  CALCIUM 8.8*  --  8.8* 9.1  PHOS  --   --  3.1 3.0    Liver Function Tests: Recent Labs  Lab 04/25/20 0616 04/27/20 0508  ALBUMIN 2.1* 2.0*   No results for input(s): LIPASE, AMYLASE in the last 168 hours. No results for input(s): AMMONIA in the last 168 hours.  CBC: Recent Labs  Lab 04/22/20 0249 04/25/20 0657 04/27/20 0508  WBC 13.3* 15.2* 15.4*  HGB 9.5* 9.2* 9.7*  HCT 31.1* 30.1* 31.2*  MCV 100.6* 101.3* 102.0*  PLT 168 178 198    Cardiac Enzymes: No results for input(s): CKTOTAL, CKMB, CKMBINDEX, TROPONINI in the last 168 hours.  BNP: Invalid input(s): POCBNP  CBG: No results for input(s): GLUCAP in the last 168 hours.  Microbiology: Results for orders placed or performed during the hospital encounter of 04/04/20  Culture, Respiratory w Gram Stain     Status: None   Collection Time: 04/05/20 12:23 PM   Specimen: Tracheal Aspirate; Respiratory   Result Value Ref Range Status   Specimen Description TRACHEAL ASPIRATE  Final   Special Requests NONE  Final   Gram Stain   Final    FEW WBC PRESENT, PREDOMINANTLY PMN ABUNDANT GRAM NEGATIVE RODS FEW GRAM POSITIVE COCCI IN PAIRS IN CLUSTERS Performed at [facility], [address] [place], [place] [address]    Culture ABUNDANT PSEUDOMONAS AERUGINOSA  Final   Report Status 04/07/2020 FINAL  Final   Organism ID, Bacteria PSEUDOMONAS AERUGINOSA  Final      Susceptibility   Pseudomonas aeruginosa - MIC*    CEFTAZIDIME 8 SENSITIVE Sensitive     CIPROFLOXACIN 0.5 SENSITIVE Sensitive     GENTAMICIN <=1 SENSITIVE Sensitive     IMIPENEM <=0.25 SENSITIVE Sensitive     * ABUNDANT PSEUDOMONAS AERUGINOSA  Culture, blood (routine x 2)     Status: None   Collection Time: 04/05/20 12:41 PM   Specimen: BLOOD LEFT HAND  Result Value Ref Range Status   Specimen Description BLOOD LEFT HAND  Final   Special Requests   Final    BOTTLES DRAWN AEROBIC AND ANAEROBIC Blood Culture adequate volume   Culture   Final    NO GROWTH 6 DAYS Performed at [facility], [address] [place], [place] [address]    Report Status 04/11/2020 FINAL  Final  Culture, blood (routine x 2)     Status: None   Collection Time: 04/05/20 12:50 PM   Specimen: BLOOD RIGHT HAND  Result Value Ref Range Status   Specimen Description BLOOD RIGHT HAND  Final   Special Requests   Final    BOTTLES DRAWN AEROBIC AND ANAEROBIC Blood Culture results may not be optimal due to an inadequate volume of blood received in culture bottles   Culture   Final    NO GROWTH 6 DAYS Performed at [facility], [place] [address], [place], [place] [address]    Report Status 04/11/2020 FINAL  Final  Culture, Respiratory w Gram Stain     Status: None   Collection Time: 04/18/20  1:18 PM   Specimen: Tracheal Aspirate; Respiratory  Result Value Ref Range Status   Specimen Description TRACHEAL ASPIRATE  Final   Special Requests  NONE  Final   Gram Stain   Final    FEW WBC PRESENT, PREDOMINANTLY PMN ABUNDANT GRAM NEGATIVE RODS RARE GRAM POSITIVE COCCI Performed at [facility], [place] [address] [place], [place] [address]    Culture ABUNDANT PSEUDOMONAS AERUGINOSA  Final   Report Status 04/21/2020 FINAL  Final   Organism ID, Bacteria PSEUDOMONAS AERUGINOSA  Final      Susceptibility   Pseudomonas aeruginosa - MIC*    CEFTAZIDIME 8 SENSITIVE Sensitive     CIPROFLOXACIN 1 SENSITIVE Sensitive     GENTAMICIN 4 SENSITIVE Sensitive     IMIPENEM <=0.25 SENSITIVE Sensitive     * ABUNDANT PSEUDOMONAS AERUGINOSA  Culture, Urine     Status: Abnormal   Collection Time: 04/18/20  1:19 PM   Specimen: Urine, Random  Result Value Ref Range Status   Specimen Description URINE, RANDOM  Final   Special Requests NONE  Final   Culture (A)  Final    <10,000 COLONIES/mL INSIGNIFICANT GROWTH Performed at [facility], [place] [address] [place], [place] [address]    Report Status 04/19/2020 FINAL  Final  Culture, blood (routine x 2)     Status: None   Collection Time: 04/18/20  1:30 PM   Specimen: BLOOD  Result Value Ref Range Status   Specimen Description BLOOD RIGHT ANTECUBITAL  Final   Special Requests   Final    BOTTLES DRAWN AEROBIC ONLY Blood Culture results may not be optimal due to an excessive volume of blood received in culture bottles   Culture   Final    NO GROWTH 5 DAYS Performed at [facility], [place] [address] [place], [place] [address]    Report Status 04/23/2020 FINAL  Final  Culture, blood (routine x 2)     Status: None   Collection Time: 04/18/20  1:43 PM   Specimen: BLOOD  Result Value Ref Range Status   Specimen Description BLOOD BLOOD RIGHT FOREARM  Final   Special Requests   Final    BOTTLES DRAWN AEROBIC AND ANAEROBIC Blood Culture adequate volume   Culture   Final    NO GROWTH 5 DAYS Performed at [facility], [place] [address] [place], [place] [address]    Report Status  04/23/2020 FINAL  Final    Coagulation Studies: No results for input(s): LABPROT, INR in the last 72 hours.  Urinalysis: No results for input(s): COLORURINE, LABSPEC, PHURINE, GLUCOSEU, HGBUR, BILIRUBINUR, KETONESUR, PROTEINUR, UROBILINOGEN, NITRITE, LEUKOCYTESUR in the last 72 hours.  Invalid input(s): APPERANCEUR    Imaging: No results found.   Medications:  Assessment/ Plan:  60 y.o. male with a PMHx of acute on chronic respiratory failure status post tracheostomy placement, paroxysmal atrial fibrillation, acute diastolic heart failure, chronic kidney disease stage IV, nonsustained ventricular tachycardia, obstructive sleep apnea, COPD, hypertension, history of status epilepticus, who was admitted to [facility] on 04/04/2020 for ongoing care.  1.  Acute kidney injury/chronic kidney disease stage IV.  When the patient left outside hospital his EGFR was 49.   -Urine output only 750 cc over the preceding 24 hours.  We will plan for dialysis again tomorrow.  2.  Acute respiratory failure.  Patient maintained on aerosolized trach collar at the moment.  3.  Anemia of chronic kidney disease.  Hemoglobin close to target at 9.7.  Continue to monitor.  4.  Hypokalemia.  Serum potassium slightly low at 3.4.  Continue to monitor.   LOS: 0 Antonio Chavez 4/22/20227:52 AM

## 2020-04-28 NOTE — Progress Notes (Signed)
Pulmonary Critical Care Medicine [place]  PROGRESS NOTE     Antonio Chavez  [id number]  DOB: [birth year]   DOA: 04/04/2020  Referring Physician: Merton Border, MD  HPI: Antonio Chavez is a 60 y.o. male seen for follow up of Acute on Chronic Respiratory Failure.  Patient currently is on T collar has been on a goal of 8 hours doing fairly well  Medications: Reviewed on Rounds  Physical Exam:  Vitals: Temperature 98.0 pulse 58 respiratory 20 blood pressure is 111/53 saturations 100%  Ventilator Settings on T collar FiO2 28%  . General: Comfortable at this time . Eyes: Grossly normal lids, irises & conjunctiva . ENT: grossly tongue is normal . Neck: no obvious mass . Cardiovascular: S1 S2 normal no gallop . Respiratory: No rhonchi very coarse breath sound . Abdomen: soft . Skin: no rash seen on limited exam . Musculoskeletal: not rigid . Psychiatric:unable to assess . Neurologic: no seizure no involuntary movements         Lab Data:   Basic Metabolic Panel: Recent Labs  Lab 04/22/20 0249 04/23/20 1124 04/25/20 0616 04/27/20 0508  NA 145  --  138 135  K 3.3* 3.5 3.4* 3.4*  CL 115*  --  108 103  CO2 22  --  24 25  GLUCOSE 125*  --  110* 112*  BUN 127*  --  103* 81*  CREATININE 2.45*  --  2.17* 2.18*  CALCIUM 8.8*  --  8.8* 9.1  PHOS  --   --  3.1 3.0    ABG: No results for input(s): PHART, PCO2ART, PO2ART, HCO3, O2SAT in the last 168 hours.  Liver Function Tests: Recent Labs  Lab 04/25/20 0616 04/27/20 0508  ALBUMIN 2.1* 2.0*   No results for input(s): LIPASE, AMYLASE in the last 168 hours. No results for input(s): AMMONIA in the last 168 hours.  CBC: Recent Labs  Lab 04/22/20 0249 04/25/20 0657 04/27/20 0508  WBC 13.3* 15.2* 15.4*  HGB 9.5* 9.2* 9.7*  HCT 31.1* 30.1* 31.2*  MCV 100.6* 101.3* 102.0*  PLT 168 178 198    Cardiac Enzymes: No results for input(s): CKTOTAL, CKMB,  CKMBINDEX, TROPONINI in the last 168 hours.  BNP (last 3 results) No results for input(s): BNP in the last 8760 hours.  ProBNP (last 3 results) No results for input(s): PROBNP in the last 8760 hours.  Radiological Exams: No results found.  Assessment/Plan Active Problems:   Acute on chronic respiratory failure with hypoxia (HCC)   Chronic kidney disease, stage IV (severe) (HCC)   Paroxysmal atrial fibrillation (HCC)   COPD, severe (HCC)   Chronic heart failure with preserved ejection fraction ([place])   1. Acute on chronic respiratory failure with hypoxia plan is to continue to wean on the T-piece goal of 8 hours today 2. Chronic kidney disease stage IV supportive care we will continue to follow along. 3. Paroxysmal atrial fibrillation rate is controlled 4. Severe COPD we will continue with medical management 5. Chronic heart failure preserved ejection fraction we will continue with supportive care   I have personally seen and evaluated the patient, evaluated laboratory and imaging results, formulated the assessment and plan and placed orders. The Patient requires high complexity decision making with multiple systems involvement.  Rounds were done with the Respiratory Therapy Director and Staff therapists and discussed with nursing staff also.  Allyne Gee, MD [facility] Pulmonary Critical Care Medicine Sleep Medicine

## 2020-04-29 DIAGNOSIS — J449 Chronic obstructive pulmonary disease, unspecified: Secondary | ICD-10-CM | POA: Diagnosis not present

## 2020-04-29 DIAGNOSIS — N184 Chronic kidney disease, stage 4 (severe): Secondary | ICD-10-CM | POA: Diagnosis not present

## 2020-04-29 DIAGNOSIS — I5032 Chronic diastolic (congestive) heart failure: Secondary | ICD-10-CM | POA: Diagnosis not present

## 2020-04-29 DIAGNOSIS — J9621 Acute and chronic respiratory failure with hypoxia: Secondary | ICD-10-CM | POA: Diagnosis not present

## 2020-04-29 LAB — CBC
HCT: 31.9 % — ABNORMAL LOW (ref 39.0–52.0)
Hemoglobin: 9.7 g/dL — ABNORMAL LOW (ref 13.0–17.0)
MCH: 30.9 pg (ref 26.0–34.0)
MCHC: 30.4 g/dL (ref 30.0–36.0)
MCV: 101.6 fL — ABNORMAL HIGH (ref 80.0–100.0)
Platelets: 238 10*3/uL (ref 150–400)
RBC: 3.14 MIL/uL — ABNORMAL LOW (ref 4.22–5.81)
RDW: 17.2 % — ABNORMAL HIGH (ref 11.5–15.5)
WBC: 14.4 10*3/uL — ABNORMAL HIGH (ref 4.0–10.5)
nRBC: 0 % (ref 0.0–0.2)

## 2020-04-29 LAB — RENAL FUNCTION PANEL
Albumin: 1.9 g/dL — ABNORMAL LOW (ref 3.5–5.0)
Anion gap: 9 (ref 5–15)
BUN: 86 mg/dL — ABNORMAL HIGH (ref 8–23)
CO2: 25 mmol/L (ref 22–32)
Calcium: 9.5 mg/dL (ref 8.9–10.3)
Chloride: 99 mmol/L (ref 98–111)
Creatinine, Ser: 2.16 mg/dL — ABNORMAL HIGH (ref 0.61–1.24)
GFR, Estimated: 34 mL/min — ABNORMAL LOW (ref >60)
Glucose, Bld: 140 mg/dL — ABNORMAL HIGH (ref 70–99)
Phosphorus: 2.7 mg/dL (ref 2.5–4.6)
Potassium: 3.7 mmol/L (ref 3.5–5.1)
Sodium: 133 mmol/L — ABNORMAL LOW (ref 135–145)

## 2020-04-29 NOTE — Progress Notes (Signed)
Pulmonary Critical Care Medicine [place]  PROGRESS NOTE     Antonio Chavez  [id number]  DOB: [birth year]   DOA: 04/04/2020  Referring Physician: Merton Border, MD  HPI: Antonio Chavez is a 60 y.o. male seen for follow up of Acute on Chronic Respiratory Failure.  Patient was able to complete 8 hours of T collar right now is resting comfortably without distress is on assist control mode  Medications: Reviewed on Rounds  Physical Exam:  Vitals: Temperature is 97.1 pulse 59 respiratory 20 blood pressure is 140/68 saturations 99%  Ventilator Settings on assist control FiO2 28% tidal line 500 PEEP 5  . General: Comfortable at this time . Eyes: Grossly normal lids, irises & conjunctiva . ENT: grossly tongue is normal . Neck: no obvious mass . Cardiovascular: S1 S2 normal no gallop . Respiratory: Scattered rhonchi expansion is equal . Abdomen: soft . Skin: no rash seen on limited exam . Musculoskeletal: not rigid . Psychiatric:unable to assess . Neurologic: no seizure no involuntary movements         Lab Data:   Basic Metabolic Panel: Recent Labs  Lab 04/23/20 1124 04/25/20 0616 04/27/20 0508 04/29/20 0725  NA  --  138 135 133*  K 3.5 3.4* 3.4* 3.7  CL  --  108 103 99  CO2  --  '24 25 25  '$ GLUCOSE  --  110* 112* 140*  BUN  --  103* 81* 86*  CREATININE  --  2.17* 2.18* 2.16*  CALCIUM  --  8.8* 9.1 9.5  PHOS  --  3.1 3.0 2.7    ABG: No results for input(s): PHART, PCO2ART, PO2ART, HCO3, O2SAT in the last 168 hours.  Liver Function Tests: Recent Labs  Lab 04/25/20 0616 04/27/20 0508 04/29/20 0725  ALBUMIN 2.1* 2.0* 1.9*   No results for input(s): LIPASE, AMYLASE in the last 168 hours. No results for input(s): AMMONIA in the last 168 hours.  CBC: Recent Labs  Lab 04/25/20 0657 04/27/20 0508 04/29/20 0725  WBC 15.2* 15.4* 14.4*  HGB 9.2* 9.7* 9.7*  HCT 30.1* 31.2* 31.9*  MCV 101.3* 102.0* 101.6*   PLT 178 198 238    Cardiac Enzymes: No results for input(s): CKTOTAL, CKMB, CKMBINDEX, TROPONINI in the last 168 hours.  BNP (last 3 results) No results for input(s): BNP in the last 8760 hours.  ProBNP (last 3 results) No results for input(s): PROBNP in the last 8760 hours.  Radiological Exams: No results found.  Assessment/Plan Active Problems:   Acute on chronic respiratory failure with hypoxia (HCC)   Chronic kidney disease, stage IV (severe) (HCC)   Paroxysmal atrial fibrillation (HCC)   COPD, severe (HCC)   Chronic heart failure with preserved ejection fraction ([place])   1. Acute on chronic respiratory failure hypoxia plan is clinically wean again today on T collar advance beyond that 8 hours 2. Chronic kidney disease stage IV we will continue to follow the patient's labs 3. Paroxysmal atrial fibrillation rate is controlled 4. Severe COPD medical management 5. Chronic heart failure preserved ejection fraction no change we will continue to follow along   I have personally seen and evaluated the patient, evaluated laboratory and imaging results, formulated the assessment and plan and placed orders. The Patient requires high complexity decision making with multiple systems involvement.  Rounds were done with the Respiratory Therapy Director and Staff therapists and discussed with nursing staff also.  Allyne Gee, MD [facility] Pulmonary Critical Care  Medicine Sleep Medicine

## 2020-05-01 ENCOUNTER — Other Ambulatory Visit (HOSPITAL_COMMUNITY): Payer: Medicare Other

## 2020-05-01 DIAGNOSIS — J9621 Acute and chronic respiratory failure with hypoxia: Secondary | ICD-10-CM | POA: Diagnosis not present

## 2020-05-01 DIAGNOSIS — J449 Chronic obstructive pulmonary disease, unspecified: Secondary | ICD-10-CM | POA: Diagnosis not present

## 2020-05-01 DIAGNOSIS — I5032 Chronic diastolic (congestive) heart failure: Secondary | ICD-10-CM | POA: Diagnosis not present

## 2020-05-01 DIAGNOSIS — N184 Chronic kidney disease, stage 4 (severe): Secondary | ICD-10-CM | POA: Diagnosis not present

## 2020-05-01 NOTE — Progress Notes (Signed)
[facility]  ROUNDING NOTE   Subjective:  Case discussed with respiratory therapy. Still on the ventilator. Appears depressed. Due for dialysis again tomorrow.  Objective:  Vital signs in last 24 hours:  Temperature 96.8 pulse 86 respirations 22 blood pressure 160/88  Physical Exam: General:  Critically ill-appearing  Head:  Normocephalic, atraumatic. Moist oral mucosal membranes  Eyes:  Anicteric  Neck:  Tracheostomy in place  Lungs:   Coarse breath sounds bilateral, normal effort  Heart:  S1S2 no rubs  Abdomen:   Soft, nontender, bowel sounds present  Extremities:  Trace peripheral edema.  Neurologic:  Awake, alert  Skin:  Bilateral upper extremity ecchymoses  Access:  Right IJ temporary dialysis catheter    Basic Metabolic Panel: Recent Labs  Lab 04/25/20 0616 04/27/20 0508 04/29/20 0725  NA 138 135 133*  K 3.4* 3.4* 3.7  CL 108 103 99  CO2 24 25 25   GLUCOSE 110* 112* 140*  BUN 103* 81* 86*  CREATININE 2.17* 2.18* 2.16*  CALCIUM 8.8* 9.1 9.5  PHOS 3.1 3.0 2.7    Liver Function Tests: Recent Labs  Lab 04/25/20 0616 04/27/20 0508 04/29/20 0725  ALBUMIN 2.1* 2.0* 1.9*   No results for input(s): LIPASE, AMYLASE in the last 168 hours. No results for input(s): AMMONIA in the last 168 hours.  CBC: Recent Labs  Lab 04/25/20 0657 04/27/20 0508 04/29/20 0725  WBC 15.2* 15.4* 14.4*  HGB 9.2* 9.7* 9.7*  HCT 30.1* 31.2* 31.9*  MCV 101.3* 102.0* 101.6*  PLT 178 198 238    Cardiac Enzymes: No results for input(s): CKTOTAL, CKMB, CKMBINDEX, TROPONINI in the last 168 hours.  BNP: Invalid input(s): POCBNP  CBG: No results for input(s): GLUCAP in the last 168 hours.  Microbiology: Results for orders placed or performed during the hospital encounter of 04/04/20  Culture, Respiratory w Gram Stain     Status: None   Collection Time: 04/05/20 12:23 PM   Specimen: Tracheal Aspirate; Respiratory  Result Value Ref Range Status   Specimen  Description TRACHEAL ASPIRATE  Final   Special Requests NONE  Final   Gram Stain   Final    FEW WBC PRESENT, PREDOMINANTLY PMN ABUNDANT GRAM NEGATIVE RODS FEW GRAM POSITIVE COCCI IN PAIRS IN CLUSTERS Performed at [facility], [address], [place], [place] [address]    Culture ABUNDANT PSEUDOMONAS AERUGINOSA  Final   Report Status 04/07/2020 FINAL  Final   Organism ID, Bacteria PSEUDOMONAS AERUGINOSA  Final      Susceptibility   Pseudomonas aeruginosa - MIC*    CEFTAZIDIME 8 SENSITIVE Sensitive     CIPROFLOXACIN 0.5 SENSITIVE Sensitive     GENTAMICIN <=1 SENSITIVE Sensitive     IMIPENEM <=0.25 SENSITIVE Sensitive     * ABUNDANT PSEUDOMONAS AERUGINOSA  Culture, blood (routine x 2)     Status: None   Collection Time: 04/05/20 12:41 PM   Specimen: BLOOD LEFT HAND  Result Value Ref Range Status   Specimen Description BLOOD LEFT HAND  Final   Special Requests   Final    BOTTLES DRAWN AEROBIC AND ANAEROBIC Blood Culture adequate volume   Culture   Final    NO GROWTH 6 DAYS Performed at [facility], [address] [place], [place] [address]    Report Status 04/11/2020 FINAL  Final  Culture, blood (routine x 2)     Status: None   Collection Time: 04/05/20 12:50 PM   Specimen: BLOOD RIGHT HAND  Result Value Ref  Range Status   Specimen Description BLOOD RIGHT HAND  Final   Special Requests   Final    BOTTLES DRAWN AEROBIC AND ANAEROBIC Blood Culture results may not be optimal due to an inadequate volume of blood received in culture bottles   Culture   Final    NO GROWTH 6 DAYS Performed at [facility], [place] [address], [place], [place] [address]    Report Status 04/11/2020 FINAL  Final  Culture, Respiratory w Gram Stain     Status: None   Collection Time: 04/18/20  1:18 PM   Specimen: Tracheal Aspirate; Respiratory  Result Value Ref Range Status   Specimen Description TRACHEAL ASPIRATE  Final   Special Requests NONE  Final   Gram Stain   Final    FEW WBC  PRESENT, PREDOMINANTLY PMN ABUNDANT GRAM NEGATIVE RODS RARE GRAM POSITIVE COCCI Performed at [facility], [place] [address] [place], [place] [address]    Culture ABUNDANT PSEUDOMONAS AERUGINOSA  Final   Report Status 04/21/2020 FINAL  Final   Organism ID, Bacteria PSEUDOMONAS AERUGINOSA  Final      Susceptibility   Pseudomonas aeruginosa - MIC*    CEFTAZIDIME 8 SENSITIVE Sensitive     CIPROFLOXACIN 1 SENSITIVE Sensitive     GENTAMICIN 4 SENSITIVE Sensitive     IMIPENEM <=0.25 SENSITIVE Sensitive     * ABUNDANT PSEUDOMONAS AERUGINOSA  Culture, Urine     Status: Abnormal   Collection Time: 04/18/20  1:19 PM   Specimen: Urine, Random  Result Value Ref Range Status   Specimen Description URINE, RANDOM  Final   Special Requests NONE  Final   Culture (A)  Final    <10,000 COLONIES/mL INSIGNIFICANT GROWTH Performed at [facility], [place] [address], [place], [place] [address]    Report Status 04/19/2020 FINAL  Final  Culture, blood (routine x 2)     Status: None   Collection Time: 04/18/20  1:30 PM   Specimen: BLOOD  Result Value Ref Range Status   Specimen Description BLOOD RIGHT ANTECUBITAL  Final   Special Requests   Final    BOTTLES DRAWN AEROBIC ONLY Blood Culture results may not be optimal due to an excessive volume of blood received in culture bottles   Culture   Final    NO GROWTH 5 DAYS Performed at [facility], [place] [address], [place], [place] [address]    Report Status 04/23/2020 FINAL  Final  Culture, blood (routine x 2)     Status: None   Collection Time: 04/18/20  1:43 PM   Specimen: BLOOD  Result Value Ref Range Status   Specimen Description BLOOD BLOOD RIGHT FOREARM  Final   Special Requests   Final    BOTTLES DRAWN AEROBIC AND ANAEROBIC Blood Culture adequate volume   Culture   Final    NO GROWTH 5 DAYS Performed at [facility], [place] [address], [place], [place] [address]    Report Status 04/23/2020 FINAL  Final    Coagulation  Studies: No results for input(s): LABPROT, INR in the last 72 hours.  Urinalysis: No results for input(s): COLORURINE, LABSPEC, PHURINE, GLUCOSEU, HGBUR, BILIRUBINUR, KETONESUR, PROTEINUR, UROBILINOGEN, NITRITE, LEUKOCYTESUR in the last 72 hours.  Invalid input(s): APPERANCEUR    Imaging: No results found.   Medications:       Assessment/ Plan:  60 y.o. male with a PMHx of acute on chronic respiratory failure status post tracheostomy placement, paroxysmal atrial fibrillation, acute  diastolic heart failure, chronic kidney disease stage IV, nonsustained ventricular tachycardia, obstructive sleep apnea, COPD, hypertension, history of status epilepticus, who was admitted to [facility] on 04/04/2020 for ongoing care.  1.  Acute kidney injury/chronic kidney disease stage IV.  When the patient left outside hospital his EGFR was 49.   -BUN currently 86 with a creatinine of 2.16.  We will plan for hemodialysis treatment again tomorrow.  2.  Acute respiratory failure.  Patient still on the ventilator this AM.  Weaning as per pulmonary/critical care.  3.  Anemia of chronic kidney disease.  Hemoglobin stable at the moment with a hemoglobin of 9.7.  4.  Hypokalemia.  Potassium up to 3.7 at last check.  Continue to periodically monitor serum potassium.   LOS: 0 Kaylana Fenstermacher 4/25/20227:52 AM

## 2020-05-01 NOTE — Progress Notes (Signed)
Pulmonary Critical Care Medicine [place]  PROGRESS NOTE     Antonio Chavez  [id number]  DOB: [birth year]   DOA: 04/04/2020  Referring Physician: Merton Border, MD  HPI: Antonio Chavez is a 60 y.o. male seen for follow up of Acute on Chronic Respiratory Failure.  Patient is resting comfortably right now without distress on pressure support mode has been on 28% FiO2  Medications: Reviewed on Rounds  Physical Exam:  Vitals: Temperature is 96.6 pulse 86 respiratory rate 22 blood pressure is 160/88 saturations 98%  Ventilator Settings on pressure support FiO2 28% pressure 12/5  . General: Comfortable at this time . Eyes: Grossly normal lids, irises & conjunctiva . ENT: grossly tongue is normal . Neck: no obvious mass . Cardiovascular: S1 S2 normal no gallop . Respiratory: No rhonchi very coarse breath sound . Abdomen: soft . Skin: no rash seen on limited exam . Musculoskeletal: not rigid . Psychiatric:unable to assess . Neurologic: no seizure no involuntary movements         Lab Data:   Basic Metabolic Panel: Recent Labs  Lab 04/25/20 0616 04/27/20 0508 04/29/20 0725  NA 138 135 133*  K 3.4* 3.4* 3.7  CL 108 103 99  CO2 '24 25 25  '$ GLUCOSE 110* 112* 140*  BUN 103* 81* 86*  CREATININE 2.17* 2.18* 2.16*  CALCIUM 8.8* 9.1 9.5  PHOS 3.1 3.0 2.7    ABG: No results for input(s): PHART, PCO2ART, PO2ART, HCO3, O2SAT in the last 168 hours.  Liver Function Tests: Recent Labs  Lab 04/25/20 0616 04/27/20 0508 04/29/20 0725  ALBUMIN 2.1* 2.0* 1.9*   No results for input(s): LIPASE, AMYLASE in the last 168 hours. No results for input(s): AMMONIA in the last 168 hours.  CBC: Recent Labs  Lab 04/25/20 0657 04/27/20 0508 04/29/20 0725  WBC 15.2* 15.4* 14.4*  HGB 9.2* 9.7* 9.7*  HCT 30.1* 31.2* 31.9*  MCV 101.3* 102.0* 101.6*  PLT 178 198 238    Cardiac Enzymes: No results for input(s): CKTOTAL, CKMB,  CKMBINDEX, TROPONINI in the last 168 hours.  BNP (last 3 results) No results for input(s): BNP in the last 8760 hours.  ProBNP (last 3 results) No results for input(s): PROBNP in the last 8760 hours.  Radiological Exams: No results found.  Assessment/Plan Active Problems:   Acute on chronic respiratory failure with hypoxia (HCC)   Chronic kidney disease, stage IV (severe) (HCC)   Paroxysmal atrial fibrillation (HCC)   COPD, severe (HCC)   Chronic heart failure with preserved ejection fraction ([place])   1. Acute on chronic respiratory failure with hypoxia plan is going to be to continue with the weaning on pressure support and then try for 16 hours on T collar goal 2. Chronic kidney disease stage IV we will continue with supportive care 3. Paroxysmal atrial fibrillation rate is controlled 4. Severe COPD no change 5. Chronic heart failure preserved ejection fraction diuretics as tolerated   I have personally seen and evaluated the patient, evaluated laboratory and imaging results, formulated the assessment and plan and placed orders. The Patient requires high complexity decision making with multiple systems involvement.  Rounds were done with the Respiratory Therapy Director and Staff therapists and discussed with nursing staff also.  Allyne Gee, MD [facility] Pulmonary Critical Care Medicine Sleep Medicine

## 2020-05-02 DIAGNOSIS — N184 Chronic kidney disease, stage 4 (severe): Secondary | ICD-10-CM | POA: Diagnosis not present

## 2020-05-02 DIAGNOSIS — J9621 Acute and chronic respiratory failure with hypoxia: Secondary | ICD-10-CM | POA: Diagnosis not present

## 2020-05-02 DIAGNOSIS — I5032 Chronic diastolic (congestive) heart failure: Secondary | ICD-10-CM | POA: Diagnosis not present

## 2020-05-02 DIAGNOSIS — J449 Chronic obstructive pulmonary disease, unspecified: Secondary | ICD-10-CM | POA: Diagnosis not present

## 2020-05-02 LAB — CBC
HCT: 30.8 % — ABNORMAL LOW (ref 39.0–52.0)
Hemoglobin: 9.5 g/dL — ABNORMAL LOW (ref 13.0–17.0)
MCH: 31.5 pg (ref 26.0–34.0)
MCHC: 30.8 g/dL (ref 30.0–36.0)
MCV: 102 fL — ABNORMAL HIGH (ref 80.0–100.0)
Platelets: 245 10*3/uL (ref 150–400)
RBC: 3.02 MIL/uL — ABNORMAL LOW (ref 4.22–5.81)
RDW: 17.1 % — ABNORMAL HIGH (ref 11.5–15.5)
WBC: 13.2 10*3/uL — ABNORMAL HIGH (ref 4.0–10.5)
nRBC: 0 % (ref 0.0–0.2)

## 2020-05-02 LAB — RENAL FUNCTION PANEL
Albumin: 1.9 g/dL — ABNORMAL LOW (ref 3.5–5.0)
Anion gap: 9 (ref 5–15)
BUN: 71 mg/dL — ABNORMAL HIGH (ref 8–23)
CO2: 28 mmol/L (ref 22–32)
Calcium: 9.5 mg/dL (ref 8.9–10.3)
Chloride: 97 mmol/L — ABNORMAL LOW (ref 98–111)
Creatinine, Ser: 2.06 mg/dL — ABNORMAL HIGH (ref 0.61–1.24)
GFR, Estimated: 36 mL/min — ABNORMAL LOW (ref >60)
Glucose, Bld: 107 mg/dL — ABNORMAL HIGH (ref 70–99)
Phosphorus: 2.5 mg/dL (ref 2.5–4.6)
Potassium: 3.4 mmol/L — ABNORMAL LOW (ref 3.5–5.1)
Sodium: 134 mmol/L — ABNORMAL LOW (ref 135–145)

## 2020-05-02 NOTE — Progress Notes (Signed)
   G tube request in IR  CT:  IMPRESSION: 1. No acute intra-abdominal pathology. 2. Postsurgical changes of Roux-en-Y gastric bypass. No bowel obstruction. Normal appendix. 3. 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 4. Aortic Atherosclerosis (ICD10-I70.0  Reviewed with Dr Dwaine Gale Unfortunately, not able to place percutaneous safely Bypass surgery contraindication for Perc G tube--- no room for IR to place  Will make MD and RN aware

## 2020-05-02 NOTE — Progress Notes (Signed)
Pulmonary Critical Care Medicine [place]  PROGRESS NOTE     Antonio Chavez  [id number]  DOB: Oct 16, 1960   DOA: 04/04/2020  Referring Physician: Merton Border, MD  HPI: Antonio Chavez is a 60 y.o. male seen for follow up of Acute on Chronic Respiratory Failure.  Patient is resting comfortably right now without distress on T collar has been on 28% FiO2  Medications: Reviewed on Rounds  Physical Exam:  Vitals: Temperature is 97.5 pulse 62 respiratory rate is 21 blood pressure is 120/68  Ventilator Settings off ventilator on T collar  . General: Comfortable at this time . Eyes: Grossly normal lids, irises & conjunctiva . ENT: grossly tongue is normal . Neck: no obvious mass . Cardiovascular: S1 S2 normal no gallop . Respiratory: No rhonchi no rales noted at this time . Abdomen: soft . Skin: no rash seen on limited exam . Musculoskeletal: not rigid . Psychiatric:unable to assess . Neurologic: no seizure no involuntary movements         Lab Data:   Basic Metabolic Panel: Recent Labs  Lab 04/27/20 0508 04/29/20 0725 05/02/20 0817  NA 135 133* 134*  K 3.4* 3.7 3.4*  CL 103 99 97*  CO2 '25 25 28  '$ GLUCOSE 112* 140* 107*  BUN 81* 86* 71*  CREATININE 2.18* 2.16* 2.06*  CALCIUM 9.1 9.5 9.5  PHOS 3.0 2.7 2.5    ABG: No results for input(s): PHART, PCO2ART, PO2ART, HCO3, O2SAT in the last 168 hours.  Liver Function Tests: Recent Labs  Lab 04/27/20 0508 04/29/20 0725 05/02/20 0817  ALBUMIN 2.0* 1.9* 1.9*   No results for input(s): LIPASE, AMYLASE in the last 168 hours. No results for input(s): AMMONIA in the last 168 hours.  CBC: Recent Labs  Lab 04/27/20 0508 04/29/20 0725 05/02/20 0817  WBC 15.4* 14.4* 13.2*  HGB 9.7* 9.7* 9.5*  HCT 31.2* 31.9* 30.8*  MCV 102.0* 101.6* 102.0*  PLT 198 238 245    Cardiac Enzymes: No results for input(s): CKTOTAL, CKMB, CKMBINDEX, TROPONINI in the last 168  hours.  BNP (last 3 results) No results for input(s): BNP in the last 8760 hours.  ProBNP (last 3 results) No results for input(s): PROBNP in the last 8760 hours.  Radiological Exams: CT ABDOMEN WO CONTRAST  Result Date: 05/01/2020 CLINICAL DATA:  60 year old male with dysphagia. EXAM: CT ABDOMEN WITHOUT CONTRAST TECHNIQUE: Multidetector CT imaging of the abdomen was performed following the standard protocol without IV contrast. COMPARISON:  Abdominal radiograph dated 04/04/2020. FINDINGS: Evaluation of this exam is limited in the absence of intravenous contrast. Lower chest: Bibasilar linear atelectasis/scarring. Coronary vascular calcification. Probable chronic trace pericardial effusion. No intra-abdominal free air or free fluid. Hepatobiliary: The liver is unremarkable. No intrahepatic biliary ductal dilatation. Cholecystectomy. No retained calcified stone noted in the central CBD. Pancreas: No acute findings. No inflammatory changes. Spleen: Normal in size without focal abnormality. Adrenals/Urinary Tract: The adrenal glands unremarkable. Atrophic left kidney. There is no hydronephrosis or nephrolithiasis on the right. Subcentimeter bilateral renal hypodense lesions are not characterized on this CT. The visualized ureters are unremarkable. Stomach/Bowel: Postsurgical changes of Roux-en-Y gastric bypass. There is a small hiatal hernia. Enteric tube with tip in the distal gastric pouch or in the proximal jejunal loops of the Roux limb. No bowel dilatation. Moderate stool throughout the colon. The appendix is normal. Vascular/Lymphatic: Mild aortoiliac atherosclerotic disease. The IVC is unremarkable. No portal venous gas. There is no adenopathy. Other: Midline vertical  anterior abdominal wall incisional scar. Musculoskeletal: Diffuse osteosclerosis, likely renal osteodystrophy. Multilevel chronic compression fractures. No acute osseous pathology. IMPRESSION: 1. No acute intra-abdominal pathology. 2.  Postsurgical changes of Roux-en-Y gastric bypass. No bowel obstruction. Normal appendix. 3. 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 4. Aortic Atherosclerosis (ICD10-I70.0). Electronically Signed   By: Anner Crete M.D.   On: 05/01/2020 16:07    Assessment/Plan Active Problems:   Acute on chronic respiratory failure with hypoxia (HCC)   Chronic kidney disease, stage IV (severe) (HCC)   Paroxysmal atrial fibrillation (HCC)   COPD, severe (HCC)   Chronic heart failure with preserved ejection fraction ([place])   1. Acute on chronic respiratory failure hypoxia plan is going to continue with T-piece on 28% FiO2 with a goal of 20 hours 2. Chronic kidney disease stage IV we will continue to monitor the patient's labs and also nephrology recommendations 3. Paroxysmal atrial fibrillation rate is controlled we will continue with supportive care 4. Severe COPD medical management 5. Chronic heart failure preserved ejection fraction we will continue to follow along   I have personally seen and evaluated the patient, evaluated laboratory and imaging results, formulated the assessment and plan and placed orders. The Patient requires high complexity decision making with multiple systems involvement.  Rounds were done with the Respiratory Therapy Director and Staff therapists and discussed with nursing staff also.  Allyne Gee, MD [facility] Pulmonary Critical Care Medicine Sleep Medicine

## 2020-05-03 DIAGNOSIS — J449 Chronic obstructive pulmonary disease, unspecified: Secondary | ICD-10-CM | POA: Diagnosis not present

## 2020-05-03 DIAGNOSIS — N184 Chronic kidney disease, stage 4 (severe): Secondary | ICD-10-CM | POA: Diagnosis not present

## 2020-05-03 DIAGNOSIS — J9621 Acute and chronic respiratory failure with hypoxia: Secondary | ICD-10-CM | POA: Diagnosis not present

## 2020-05-03 DIAGNOSIS — I5032 Chronic diastolic (congestive) heart failure: Secondary | ICD-10-CM | POA: Diagnosis not present

## 2020-05-03 NOTE — Progress Notes (Signed)
[facility]  ROUNDING NOTE   Subjective:  Patient declined hemodialysis yesterday. Patient and family considering comfort care.   Objective:  Vital signs in last 24 hours:  Temperature 98.2 pulse 54 respirations 30 blood pressure 150/83  Physical Exam: General:  Critically ill-appearing  Head:  Normocephalic, atraumatic. Moist oral mucosal membranes  Eyes:  Anicteric  Neck:  Tracheostomy in place, T-piece in place  Lungs:   Coarse breath sounds bilateral, normal effort  Heart:  S1S2 no rubs  Abdomen:   Soft, nontender, bowel sounds present  Extremities:  Trace peripheral edema.  Neurologic:  Lethargic but arousable  Skin:  Bilateral upper extremity ecchymoses  Access:  Right IJ temporary dialysis catheter    Basic Metabolic Panel: Recent Labs  Lab 04/27/20 0508 04/29/20 0725 05/02/20 0817  NA 135 133* 134*  K 3.4* 3.7 3.4*  CL 103 99 97*  CO2 _0 GLUCOSE 112* 140* 107*  BUN 81* 86* 71*  CREATININE 2.18* 2.16* 2.06*  CALCIUM 9.1 9.5 9.5  PHOS 3.0 2.7 2.5    Liver Function Tests: Recent Labs  Lab 04/27/20 0508 04/29/20 0725 05/02/20 0817  ALBUMIN 2.0* 1.9* 1.9*   No results for input(s): LIPASE, AMYLASE in the last 168 hours. No results for input(s): AMMONIA in the last 168 hours.  CBC: Recent Labs  Lab 04/27/20 0508 04/29/20 0725 05/02/20 0817  WBC 15.4* 14.4* 13.2*  HGB 9.7* 9.7* 9.5*  HCT 31.2* 31.9* 30.8*  MCV 102.0* 101.6* 102.0*  PLT 198 238 245    Cardiac Enzymes: No results for input(s): CKTOTAL, CKMB, CKMBINDEX, TROPONINI in the last 168 hours.  BNP: Invalid input(s): POCBNP  CBG: No results for input(s): GLUCAP in the last 168 hours.  Microbiology: Results for orders placed or performed during the hospital encounter of 04/04/20  Culture, Respiratory w Gram Stain     Status: None   Collection Time: 04/05/20 12:23 PM   Specimen: Tracheal Aspirate; Respiratory  Result Value Ref Range Status   Specimen  Description TRACHEAL ASPIRATE  Final   Special Requests NONE  Final   Gram Stain   Final    FEW WBC PRESENT, PREDOMINANTLY PMN ABUNDANT GRAM NEGATIVE RODS FEW GRAM POSITIVE COCCI IN PAIRS IN CLUSTERS Performed at [facility], [address] [place], [place] [address]    Culture ABUNDANT PSEUDOMONAS AERUGINOSA  Final   Report Status 04/07/2020 FINAL  Final   Organism ID, Bacteria PSEUDOMONAS AERUGINOSA  Final      Susceptibility   Pseudomonas aeruginosa - MIC*    CEFTAZIDIME 8 SENSITIVE Sensitive     CIPROFLOXACIN 0.5 SENSITIVE Sensitive     GENTAMICIN <=1 SENSITIVE Sensitive     IMIPENEM <=0.25 SENSITIVE Sensitive     * ABUNDANT PSEUDOMONAS AERUGINOSA  Culture, blood (routine x 2)     Status: None   Collection Time: 04/05/20 12:41 PM   Specimen: BLOOD LEFT HAND  Result Value Ref Range Status   Specimen Description BLOOD LEFT HAND  Final   Special Requests   Final    BOTTLES DRAWN AEROBIC AND ANAEROBIC Blood Culture adequate volume   Culture   Final    NO GROWTH 6 DAYS Performed at [facility], [address] [place], [place] [address]    Report Status 04/11/2020 FINAL  Final  Culture, blood (routine x 2)     Status: None   Collection Time: 04/05/20 12:50 PM   Specimen: BLOOD RIGHT HAND  Result Value Ref Range  Status   Specimen Description BLOOD RIGHT HAND  Final   Special Requests   Final    BOTTLES DRAWN AEROBIC AND ANAEROBIC Blood Culture results may not be optimal due to an inadequate volume of blood received in culture bottles   Culture   Final    NO GROWTH 6 DAYS Performed at [facility], [place] [address], [place], [place] [address]    Report Status 04/11/2020 FINAL  Final  Culture, Respiratory w Gram Stain     Status: None   Collection Time: 04/18/20  1:18 PM   Specimen: Tracheal Aspirate; Respiratory  Result Value Ref Range Status   Specimen Description TRACHEAL ASPIRATE  Final   Special Requests NONE  Final   Gram Stain   Final    FEW WBC  PRESENT, PREDOMINANTLY PMN ABUNDANT GRAM NEGATIVE RODS RARE GRAM POSITIVE COCCI Performed at [facility], [place] [address], [place], [place] [address]    Culture ABUNDANT PSEUDOMONAS AERUGINOSA  Final   Report Status 04/21/2020 FINAL  Final   Organism ID, Bacteria PSEUDOMONAS AERUGINOSA  Final      Susceptibility   Pseudomonas aeruginosa - MIC*    CEFTAZIDIME 8 SENSITIVE Sensitive     CIPROFLOXACIN 1 SENSITIVE Sensitive     GENTAMICIN 4 SENSITIVE Sensitive     IMIPENEM <=0.25 SENSITIVE Sensitive     * ABUNDANT PSEUDOMONAS AERUGINOSA  Culture, Urine     Status: Abnormal   Collection Time: 04/18/20  1:19 PM   Specimen: Urine, Random  Result Value Ref Range Status   Specimen Description URINE, RANDOM  Final   Special Requests NONE  Final   Culture (A)  Final    <10,000 COLONIES/mL INSIGNIFICANT GROWTH Performed at [facility], [place] [address] [place], [place] [address]    Report Status 04/19/2020 FINAL  Final  Culture, blood (routine x 2)     Status: None   Collection Time: 04/18/20  1:30 PM   Specimen: BLOOD  Result Value Ref Range Status   Specimen Description BLOOD RIGHT ANTECUBITAL  Final   Special Requests   Final    BOTTLES DRAWN AEROBIC ONLY Blood Culture results may not be optimal due to an excessive volume of blood received in culture bottles   Culture   Final    NO GROWTH 5 DAYS Performed at [facility], [place] [address], [place], [place] [address]    Report Status 04/23/2020 FINAL  Final  Culture, blood (routine x 2)     Status: None   Collection Time: 04/18/20  1:43 PM   Specimen: BLOOD  Result Value Ref Range Status   Specimen Description BLOOD BLOOD RIGHT FOREARM  Final   Special Requests   Final    BOTTLES DRAWN AEROBIC AND ANAEROBIC Blood Culture adequate volume   Culture   Final    NO GROWTH 5 DAYS Performed at [facility], [place] [address], [place], [place] [address]    Report Status 04/23/2020 FINAL  Final    Coagulation  Studies: No results for input(s): LABPROT, INR in the last 72 hours.  Urinalysis: No results for input(s): COLORURINE, LABSPEC, PHURINE, GLUCOSEU, HGBUR, BILIRUBINUR, KETONESUR, PROTEINUR, UROBILINOGEN, NITRITE, LEUKOCYTESUR in the last 72 hours.  Invalid input(s): APPERANCEUR    Imaging: CT ABDOMEN WO CONTRAST  Result Date: 05/01/2020 CLINICAL DATA:  60 year old male with dysphagia. EXAM: CT ABDOMEN WITHOUT CONTRAST TECHNIQUE: Multidetector CT imaging of the abdomen was performed following the standard protocol without IV contrast.  COMPARISON:  Abdominal radiograph dated 04/04/2020. FINDINGS: Evaluation of this exam is limited in the absence of intravenous contrast. Lower chest: Bibasilar linear atelectasis/scarring. Coronary vascular calcification. Probable chronic trace pericardial effusion. No intra-abdominal free air or free fluid. Hepatobiliary: The liver is unremarkable. No intrahepatic biliary ductal dilatation. Cholecystectomy. No retained calcified stone noted in the central CBD. Pancreas: No acute findings. No inflammatory changes. Spleen: Normal in size without focal abnormality. Adrenals/Urinary Tract: The adrenal glands unremarkable. Atrophic left kidney. There is no hydronephrosis or nephrolithiasis on the right. Subcentimeter bilateral renal hypodense lesions are not characterized on this CT. The visualized ureters are unremarkable. Stomach/Bowel: Postsurgical changes of Roux-en-Y gastric bypass. There is a small hiatal hernia. Enteric tube with tip in the distal gastric pouch or in the proximal jejunal loops of the Roux limb. No bowel dilatation. Moderate stool throughout the colon. The appendix is normal. Vascular/Lymphatic: Mild aortoiliac atherosclerotic disease. The IVC is unremarkable. No portal venous gas. There is no adenopathy. Other: Midline vertical anterior abdominal wall incisional scar. Musculoskeletal: Diffuse osteosclerosis, likely renal osteodystrophy. Multilevel chronic  compression fractures. No acute osseous pathology. IMPRESSION: 1. No acute intra-abdominal pathology. 2. Postsurgical changes of Roux-en-Y gastric bypass. No bowel obstruction. Normal appendix. 3. 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 4. Aortic Atherosclerosis (ICD10-I70.0). Electronically Signed   By: Anner Crete M.D.   On: 05/01/2020 16:07     Medications:       Assessment/ Plan:  60 y.o. male with a PMHx of acute on chronic respiratory failure status post tracheostomy placement, paroxysmal atrial fibrillation, acute diastolic heart failure, chronic kidney disease stage IV, nonsustained ventricular tachycardia, obstructive sleep apnea, COPD, hypertension, history of status epilepticus, who was admitted to [facility] on 04/04/2020 for ongoing care.  1.  Acute kidney injury/chronic kidney disease stage IV.  When the patient left outside hospital his EGFR was 49.   -Patient declined hemodialysis treatment yesterday.  Patient and family considering comfort care.  Hold off on further dialysis until goals of care clarified.  2.  Acute respiratory failure.  Currently off the ventilator and on T-piece.  3.  Anemia of chronic kidney disease.  Hemoglobin currently 9.5.  Continue periodically monitor.  4.  Hypokalemia.  Potassium slightly lower today at 3.4.  Should rise with lack of dialysis.   LOS: 0 Chauncey Sciulli 4/27/20221:44 PM

## 2020-05-03 NOTE — Progress Notes (Signed)
Pulmonary Critical Care Medicine [place]  PROGRESS NOTE     Antonio Chavez  [id number]  DOB: 02/15/1960   DOA: 04/04/2020  Referring Physician: Merton Border, MD  HPI: Antonio Chavez is a 60 y.o. male seen for follow up of Acute on Chronic Respiratory Failure.  Patient is currently on T collar has been on a goal of 24 hours  Medications: Reviewed on Rounds  Physical Exam:  Vitals: Temperature is 98.2 pulse 54 respiratory 30 blood pressure is 150/73 saturations 96%  Ventilator Settings off the vent on T collar  . General: Comfortable at this time . Eyes: Grossly normal lids, irises & conjunctiva . ENT: grossly tongue is normal . Neck: no obvious mass . Cardiovascular: S1 S2 normal no gallop . Respiratory: No rhonchi no rales are noted at this time . Abdomen: soft . Skin: no rash seen on limited exam . Musculoskeletal: not rigid . Psychiatric:unable to assess . Neurologic: no seizure no involuntary movements         Lab Data:   Basic Metabolic Panel: Recent Labs  Lab 04/27/20 0508 04/29/20 0725 05/02/20 0817  NA 135 133* 134*  K 3.4* 3.7 3.4*  CL 103 99 97*  CO2 '25 25 28  '$ GLUCOSE 112* 140* 107*  BUN 81* 86* 71*  CREATININE 2.18* 2.16* 2.06*  CALCIUM 9.1 9.5 9.5  PHOS 3.0 2.7 2.5    ABG: No results for input(s): PHART, PCO2ART, PO2ART, HCO3, O2SAT in the last 168 hours.  Liver Function Tests: Recent Labs  Lab 04/27/20 0508 04/29/20 0725 05/02/20 0817  ALBUMIN 2.0* 1.9* 1.9*   No results for input(s): LIPASE, AMYLASE in the last 168 hours. No results for input(s): AMMONIA in the last 168 hours.  CBC: Recent Labs  Lab 04/27/20 0508 04/29/20 0725 05/02/20 0817  WBC 15.4* 14.4* 13.2*  HGB 9.7* 9.7* 9.5*  HCT 31.2* 31.9* 30.8*  MCV 102.0* 101.6* 102.0*  PLT 198 238 245    Cardiac Enzymes: No results for input(s): CKTOTAL, CKMB, CKMBINDEX, TROPONINI in the last 168 hours.  BNP (last 3  results) No results for input(s): BNP in the last 8760 hours.  ProBNP (last 3 results) No results for input(s): PROBNP in the last 8760 hours.  Radiological Exams: CT ABDOMEN WO CONTRAST  Result Date: 05/01/2020 CLINICAL DATA:  60 year old male with dysphagia. EXAM: CT ABDOMEN WITHOUT CONTRAST TECHNIQUE: Multidetector CT imaging of the abdomen was performed following the standard protocol without IV contrast. COMPARISON:  Abdominal radiograph dated 04/04/2020. FINDINGS: Evaluation of this exam is limited in the absence of intravenous contrast. Lower chest: Bibasilar linear atelectasis/scarring. Coronary vascular calcification. Probable chronic trace pericardial effusion. No intra-abdominal free air or free fluid. Hepatobiliary: The liver is unremarkable. No intrahepatic biliary ductal dilatation. Cholecystectomy. No retained calcified stone noted in the central CBD. Pancreas: No acute findings. No inflammatory changes. Spleen: Normal in size without focal abnormality. Adrenals/Urinary Tract: The adrenal glands unremarkable. Atrophic left kidney. There is no hydronephrosis or nephrolithiasis on the right. Subcentimeter bilateral renal hypodense lesions are not characterized on this CT. The visualized ureters are unremarkable. Stomach/Bowel: Postsurgical changes of Roux-en-Y gastric bypass. There is a small hiatal hernia. Enteric tube with tip in the distal gastric pouch or in the proximal jejunal loops of the Roux limb. No bowel dilatation. Moderate stool throughout the colon. The appendix is normal. Vascular/Lymphatic: Mild aortoiliac atherosclerotic disease. The IVC is unremarkable. No portal venous gas. There is no adenopathy. Other: Midline vertical  anterior abdominal wall incisional scar. Musculoskeletal: Diffuse osteosclerosis, likely renal osteodystrophy. Multilevel chronic compression fractures. No acute osseous pathology. IMPRESSION: 1. No acute intra-abdominal pathology. 2. Postsurgical changes of  Roux-en-Y gastric bypass. No bowel obstruction. Normal appendix. 3. 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 4. Aortic Atherosclerosis (ICD10-I70.0). Electronically Signed   By: Anner Crete M.D.   On: 05/01/2020 16:07    Assessment/Plan Active Problems:   Acute on chronic respiratory failure with hypoxia (HCC)   Chronic kidney disease, stage IV (severe) (HCC)   Paroxysmal atrial fibrillation (HCC)   COPD, severe (HCC)   Chronic heart failure with preserved ejection fraction ([place])   1. Acute on chronic respiratory failure hypoxia we will continue to wean on T collar goal is 24 hours. 2. Chronic kidney disease stage IV followed by nephrology 3. Paroxysmal atrial fibrillation rate is controlled 4. Severe COPD we will continue with medical management 5. Chronic heart failure preserved ejection fraction no change we will continue to follow along.   I have personally seen and evaluated the patient, evaluated laboratory and imaging results, formulated the assessment and plan and placed orders. The Patient requires high complexity decision making with multiple systems involvement.  Rounds were done with the Respiratory Therapy Director and Staff therapists and discussed with nursing staff also.  Allyne Gee, MD [facility] Pulmonary Critical Care Medicine Sleep Medicine

## 2020-05-04 DIAGNOSIS — I5032 Chronic diastolic (congestive) heart failure: Secondary | ICD-10-CM | POA: Diagnosis not present

## 2020-05-04 DIAGNOSIS — N184 Chronic kidney disease, stage 4 (severe): Secondary | ICD-10-CM | POA: Diagnosis not present

## 2020-05-04 DIAGNOSIS — J449 Chronic obstructive pulmonary disease, unspecified: Secondary | ICD-10-CM | POA: Diagnosis not present

## 2020-05-04 DIAGNOSIS — J9621 Acute and chronic respiratory failure with hypoxia: Secondary | ICD-10-CM | POA: Diagnosis not present

## 2020-05-04 LAB — RENAL FUNCTION PANEL
Albumin: 1.9 g/dL — ABNORMAL LOW (ref 3.5–5.0)
Anion gap: 12 (ref 5–15)
BUN: 134 mg/dL — ABNORMAL HIGH (ref 8–23)
CO2: 25 mmol/L (ref 22–32)
Calcium: 9.6 mg/dL (ref 8.9–10.3)
Chloride: 99 mmol/L (ref 98–111)
Creatinine, Ser: 2.22 mg/dL — ABNORMAL HIGH (ref 0.61–1.24)
GFR, Estimated: 33 mL/min — ABNORMAL LOW (ref >60)
Glucose, Bld: 122 mg/dL — ABNORMAL HIGH (ref 70–99)
Phosphorus: 2.5 mg/dL (ref 2.5–4.6)
Potassium: 3.3 mmol/L — ABNORMAL LOW (ref 3.5–5.1)
Sodium: 136 mmol/L (ref 135–145)

## 2020-05-04 LAB — CBC
HCT: 30.6 % — ABNORMAL LOW (ref 39.0–52.0)
Hemoglobin: 9.4 g/dL — ABNORMAL LOW (ref 13.0–17.0)
MCH: 31.1 pg (ref 26.0–34.0)
MCHC: 30.7 g/dL (ref 30.0–36.0)
MCV: 101.3 fL — ABNORMAL HIGH (ref 80.0–100.0)
Platelets: 279 10*3/uL (ref 150–400)
RBC: 3.02 MIL/uL — ABNORMAL LOW (ref 4.22–5.81)
RDW: 16.9 % — ABNORMAL HIGH (ref 11.5–15.5)
WBC: 10.3 10*3/uL (ref 4.0–10.5)
nRBC: 0 % (ref 0.0–0.2)

## 2020-05-04 NOTE — Progress Notes (Signed)
Pulmonary Critical Care Medicine [place]  PROGRESS NOTE     Antonio Chavez  [id number]  DOB: Jun 15, 1960   DOA: 04/04/2020  Referring Physician: Merton Border, MD  HPI: Antonio Chavez is a 60 y.o. male seen for follow up of Acute on Chronic Respiratory Failure.  Patient is on T collar has been on 30% FiO2 with a goal of 24 hours  Medications: Reviewed on Rounds  Physical Exam:  Vitals: Temperature is 98.4 pulse 90 respiratory rate is 20 blood pressure is 114/64 saturations 98%  Ventilator Settings on T-piece right now on 30% FiO2  . General: Comfortable at this time . Eyes: Grossly normal lids, irises & conjunctiva . ENT: grossly tongue is normal . Neck: no obvious mass . Cardiovascular: S1 S2 normal no gallop . Respiratory: No rhonchi very coarse breath sounds . Abdomen: soft . Skin: no rash seen on limited exam . Musculoskeletal: not rigid . Psychiatric:unable to assess . Neurologic: no seizure no involuntary movements         Lab Data:   Basic Metabolic Panel: Recent Labs  Lab 04/29/20 0725 05/02/20 0817 05/04/20 0832  NA 133* 134* 136  K 3.7 3.4* 3.3*  CL 99 97* 99  CO2 '25 28 25  '$ GLUCOSE 140* 107* 122*  BUN 86* 71* 134*  CREATININE 2.16* 2.06* 2.22*  CALCIUM 9.5 9.5 9.6  PHOS 2.7 2.5 2.5    ABG: No results for input(s): PHART, PCO2ART, PO2ART, HCO3, O2SAT in the last 168 hours.  Liver Function Tests: Recent Labs  Lab 04/29/20 0725 05/02/20 0817 05/04/20 0832  ALBUMIN 1.9* 1.9* 1.9*   No results for input(s): LIPASE, AMYLASE in the last 168 hours. No results for input(s): AMMONIA in the last 168 hours.  CBC: Recent Labs  Lab 04/29/20 0725 05/02/20 0817 05/04/20 0832  WBC 14.4* 13.2* 10.3  HGB 9.7* 9.5* 9.4*  HCT 31.9* 30.8* 30.6*  MCV 101.6* 102.0* 101.3*  PLT 238 245 279    Cardiac Enzymes: No results for input(s): CKTOTAL, CKMB, CKMBINDEX, TROPONINI in the last 168  hours.  BNP (last 3 results) No results for input(s): BNP in the last 8760 hours.  ProBNP (last 3 results) No results for input(s): PROBNP in the last 8760 hours.  Radiological Exams: No results found.  Assessment/Plan Active Problems:   Acute on chronic respiratory failure with hypoxia (HCC)   Chronic kidney disease, stage IV (severe) (HCC)   Paroxysmal atrial fibrillation (HCC)   COPD, severe (HCC)   Chronic heart failure with preserved ejection fraction ([place])   1. Acute on chronic respiratory failure with hypoxia plan is to continue with T-piece for now requiring 30% FiO2 the goal is for 24 hours 2. Chronic kidney disease stage IV we will continue with supportive care 3. Paroxysmal atrial fibrillation rate is controlled 4. Severe COPD medical management 5. Chronic heart failure preserved ejection fraction patient is at baseline   I have personally seen and evaluated the patient, evaluated laboratory and imaging results, formulated the assessment and plan and placed orders. The Patient requires high complexity decision making with multiple systems involvement.  Rounds were done with the Respiratory Therapy Director and Staff therapists and discussed with nursing staff also.  Allyne Gee, MD [facility] Pulmonary Critical Care Medicine Sleep Medicine

## 2020-05-05 DIAGNOSIS — I5032 Chronic diastolic (congestive) heart failure: Secondary | ICD-10-CM | POA: Diagnosis not present

## 2020-05-05 DIAGNOSIS — J449 Chronic obstructive pulmonary disease, unspecified: Secondary | ICD-10-CM | POA: Diagnosis not present

## 2020-05-05 DIAGNOSIS — J9621 Acute and chronic respiratory failure with hypoxia: Secondary | ICD-10-CM | POA: Diagnosis not present

## 2020-05-05 DIAGNOSIS — N184 Chronic kidney disease, stage 4 (severe): Secondary | ICD-10-CM | POA: Diagnosis not present

## 2020-05-05 LAB — BLOOD GAS, ARTERIAL
Acid-Base Excess: 3.8 mmol/L — ABNORMAL HIGH (ref 0.0–2.0)
Bicarbonate: 29.1 mmol/L — ABNORMAL HIGH (ref 20.0–28.0)
FIO2: 28
O2 Saturation: 97.4 %
Patient temperature: 36.2
pCO2 arterial: 53.4 mmHg — ABNORMAL HIGH (ref 32.0–48.0)
pH, Arterial: 7.351 (ref 7.350–7.450)
pO2, Arterial: 85.1 mmHg (ref 83.0–108.0)

## 2020-05-05 LAB — POTASSIUM: Potassium: 3.8 mmol/L (ref 3.5–5.1)

## 2020-05-05 NOTE — Progress Notes (Signed)
Pulmonary Critical Care Medicine [place]  PROGRESS NOTE     Antonio Chavez  [id number]  DOB: 1960-06-24   DOA: 04/04/2020  Referring Physician: Merton Border, MD  HPI: Antonio Chavez is a 60 y.o. male seen for follow up of Acute on Chronic Respiratory Failure.  Patient is resting comfortably right now without distress at this time no fevers are noted.  Medications: Reviewed on Rounds  Physical Exam:  Vitals: Temperature 96.7 pulse was 87 respiratory rate was 22 blood pressure 143/84 saturations 100%  Ventilator Settings patient is on T collar FiO2 28  . General: Comfortable at this time . Eyes: Grossly normal lids, irises & conjunctiva . ENT: grossly tongue is normal . Neck: no obvious mass . Cardiovascular: S1 S2 normal no gallop . Respiratory: No rhonchi very coarse breath sound . Abdomen: soft . Skin: no rash seen on limited exam . Musculoskeletal: not rigid . Psychiatric:unable to assess . Neurologic: no seizure no involuntary movements         Lab Data:   Basic Metabolic Panel: Recent Labs  Lab 04/29/20 0725 05/02/20 0817 05/04/20 0832 05/05/20 0842  NA 133* 134* 136  --   K 3.7 3.4* 3.3* 3.8  CL 99 97* 99  --   CO2 '25 28 25  '$ --   GLUCOSE 140* 107* 122*  --   BUN 86* 71* 134*  --   CREATININE 2.16* 2.06* 2.22*  --   CALCIUM 9.5 9.5 9.6  --   PHOS 2.7 2.5 2.5  --     ABG: Recent Labs  Lab 05/05/20 0807  PHART 7.351  PCO2ART 53.4*  PO2ART 85.1  HCO3 29.1*  O2SAT 97.4    Liver Function Tests: Recent Labs  Lab 04/29/20 0725 05/02/20 0817 05/04/20 0832  ALBUMIN 1.9* 1.9* 1.9*   No results for input(s): LIPASE, AMYLASE in the last 168 hours. No results for input(s): AMMONIA in the last 168 hours.  CBC: Recent Labs  Lab 04/29/20 0725 05/02/20 0817 05/04/20 0832  WBC 14.4* 13.2* 10.3  HGB 9.7* 9.5* 9.4*  HCT 31.9* 30.8* 30.6*  MCV 101.6* 102.0* 101.3*  PLT 238 245 279     Cardiac Enzymes: No results for input(s): CKTOTAL, CKMB, CKMBINDEX, TROPONINI in the last 168 hours.  BNP (last 3 results) No results for input(s): BNP in the last 8760 hours.  ProBNP (last 3 results) No results for input(s): PROBNP in the last 8760 hours.  Radiological Exams: No results found.  Assessment/Plan Active Problems:   Acute on chronic respiratory failure with hypoxia (HCC)   Chronic kidney disease, stage IV (severe) (HCC)   Paroxysmal atrial fibrillation (HCC)   COPD, severe (HCC)   Chronic heart failure with preserved ejection fraction ([place])   1. Acute on chronic respiratory failure hypoxia plan is going to be to wean on the T collar completing 24 hours 2. Chronic kidney disease stage IV following up with nephrology 3. Paroxysmal atrial fibrillation rate controlled 4. Severe COPD medical management 5. Chronic heart failure preserved ejection fraction no change continue to monitor   I have personally seen and evaluated the patient, evaluated laboratory and imaging results, formulated the assessment and plan and placed orders. The Patient requires high complexity decision making with multiple systems involvement.  Rounds were done with the Respiratory Therapy Director and Staff therapists and discussed with nursing staff also.  Allyne Gee, MD [facility] Pulmonary Critical Care Medicine Sleep Medicine

## 2020-05-05 NOTE — Progress Notes (Signed)
[facility]  ROUNDING NOTE   Subjective:  Patient seen and evaluated at bedside. Off of dialysis at this time. Currently on T-piece.   Objective:  Vital signs in last 24 hours:  Temperature 96.7 pulse 87 respirations 47 blood pressure 143/84  Physical Exam: General:  Chronically ill-appearing  Head:  Normocephalic, atraumatic. Moist oral mucosal membranes  Eyes:  Anicteric  Neck:  Tracheostomy in place, T-piece in place  Lungs:   Coarse breath sounds bilateral, normal effort  Heart:  S1S2 no rubs  Abdomen:   Soft, nontender, bowel sounds present  Extremities:  Trace peripheral edema.  Neurologic:  Awake, alert  Skin:  Bilateral upper extremity ecchymoses  Access:  Right IJ temporary dialysis catheter    Basic Metabolic Panel: Recent Labs  Lab 04/29/20 0725 05/02/20 0817 05/04/20 0832 05/05/20 0842  NA 133* 134* 136  --   K 3.7 3.4* 3.3* 3.8  CL 99 97* 99  --   CO2 25 28 25   --   GLUCOSE 140* 107* 122*  --   BUN 86* 71* 134*  --   CREATININE 2.16* 2.06* 2.22*  --   CALCIUM 9.5 9.5 9.6  --   PHOS 2.7 2.5 2.5  --     Liver Function Tests: Recent Labs  Lab 04/29/20 0725 05/02/20 0817 05/04/20 0832  ALBUMIN 1.9* 1.9* 1.9*   No results for input(s): LIPASE, AMYLASE in the last 168 hours. No results for input(s): AMMONIA in the last 168 hours.  CBC: Recent Labs  Lab 04/29/20 0725 05/02/20 0817 05/04/20 0832  WBC 14.4* 13.2* 10.3  HGB 9.7* 9.5* 9.4*  HCT 31.9* 30.8* 30.6*  MCV 101.6* 102.0* 101.3*  PLT 238 245 279    Cardiac Enzymes: No results for input(s): CKTOTAL, CKMB, CKMBINDEX, TROPONINI in the last 168 hours.  BNP: Invalid input(s): POCBNP  CBG: No results for input(s): GLUCAP in the last 168 hours.  Microbiology: Results for orders placed or performed during the hospital encounter of 04/04/20  Culture, Respiratory w Gram Stain     Status: None   Collection Time: 04/05/20 12:23 PM   Specimen: Tracheal Aspirate; Respiratory   Result Value Ref Range Status   Specimen Description TRACHEAL ASPIRATE  Final   Special Requests NONE  Final   Gram Stain   Final    FEW WBC PRESENT, PREDOMINANTLY PMN ABUNDANT GRAM NEGATIVE RODS FEW GRAM POSITIVE COCCI IN PAIRS IN CLUSTERS Performed at [facility], [address], [place], [place] [address]    Culture ABUNDANT PSEUDOMONAS AERUGINOSA  Final   Report Status 04/07/2020 FINAL  Final   Organism ID, Bacteria PSEUDOMONAS AERUGINOSA  Final      Susceptibility   Pseudomonas aeruginosa - MIC*    CEFTAZIDIME 8 SENSITIVE Sensitive     CIPROFLOXACIN 0.5 SENSITIVE Sensitive     GENTAMICIN <=1 SENSITIVE Sensitive     IMIPENEM <=0.25 SENSITIVE Sensitive     * ABUNDANT PSEUDOMONAS AERUGINOSA  Culture, blood (routine x 2)     Status: None   Collection Time: 04/05/20 12:41 PM   Specimen: BLOOD LEFT HAND  Result Value Ref Range Status   Specimen Description BLOOD LEFT HAND  Final   Special Requests   Final    BOTTLES DRAWN AEROBIC AND ANAEROBIC Blood Culture adequate volume   Culture   Final    NO GROWTH 6 DAYS Performed at [facility], [address] [place],  [address]    Report Status 04/11/2020 FINAL  Final  Culture, blood (routine x 2)     Status: None   Collection Time: 04/05/20 12:50 PM   Specimen: BLOOD RIGHT HAND  Result Value Ref Range Status   Specimen Description BLOOD RIGHT HAND  Final   Special Requests   Final    BOTTLES DRAWN AEROBIC AND ANAEROBIC Blood Culture results may not be optimal due to an inadequate volume of blood received in culture bottles   Culture   Final    NO GROWTH 6 DAYS Performed at [facility], [place] [address] [place], [place] [address]    Report Status 04/11/2020 FINAL  Final  Culture, Respiratory w Gram Stain     Status: None   Collection Time: 04/18/20  1:18 PM   Specimen: Tracheal Aspirate; Respiratory  Result Value Ref Range Status   Specimen Description TRACHEAL ASPIRATE  Final   Special Requests  NONE  Final   Gram Stain   Final    FEW WBC PRESENT, PREDOMINANTLY PMN ABUNDANT GRAM NEGATIVE RODS RARE GRAM POSITIVE COCCI Performed at [facility], [place] [address], [place], [place] [address]    Culture ABUNDANT PSEUDOMONAS AERUGINOSA  Final   Report Status 04/21/2020 FINAL  Final   Organism ID, Bacteria PSEUDOMONAS AERUGINOSA  Final      Susceptibility   Pseudomonas aeruginosa - MIC*    CEFTAZIDIME 8 SENSITIVE Sensitive     CIPROFLOXACIN 1 SENSITIVE Sensitive     GENTAMICIN 4 SENSITIVE Sensitive     IMIPENEM <=0.25 SENSITIVE Sensitive     * ABUNDANT PSEUDOMONAS AERUGINOSA  Culture, Urine     Status: Abnormal   Collection Time: 04/18/20  1:19 PM   Specimen: Urine, Random  Result Value Ref Range Status   Specimen Description URINE, RANDOM  Final   Special Requests NONE  Final   Culture (A)  Final    <10,000 COLONIES/mL INSIGNIFICANT GROWTH Performed at [facility], [place] [address], [place], [place] [address]    Report Status 04/19/2020 FINAL  Final  Culture, blood (routine x 2)     Status: None   Collection Time: 04/18/20  1:30 PM   Specimen: BLOOD  Result Value Ref Range Status   Specimen Description BLOOD RIGHT ANTECUBITAL  Final   Special Requests   Final    BOTTLES DRAWN AEROBIC ONLY Blood Culture results may not be optimal due to an excessive volume of blood received in culture bottles   Culture   Final    NO GROWTH 5 DAYS Performed at [facility], [place] [address], [place], [place] [address]    Report Status 04/23/2020 FINAL  Final  Culture, blood (routine x 2)     Status: None   Collection Time: 04/18/20  1:43 PM   Specimen: BLOOD  Result Value Ref Range Status   Specimen Description BLOOD BLOOD RIGHT FOREARM  Final   Special Requests   Final    BOTTLES DRAWN AEROBIC AND ANAEROBIC Blood Culture adequate volume   Culture   Final    NO GROWTH 5 DAYS Performed at [facility], [place] [address], [place], [place] [address]    Report Status  04/23/2020 FINAL  Final    Coagulation Studies: No results for input(s): LABPROT, INR in the last 72 hours.  Urinalysis: No results for input(s): COLORURINE, LABSPEC, PHURINE, GLUCOSEU, HGBUR, BILIRUBINUR, KETONESUR, PROTEINUR, UROBILINOGEN, NITRITE, LEUKOCYTESUR in the last 72 hours.  Invalid input(s): APPERANCEUR    Imaging: No results found.  Medications:       Assessment/ Plan:  60 y.o. male with a PMHx of acute on chronic respiratory failure status post tracheostomy placement, paroxysmal atrial fibrillation, acute diastolic heart failure, chronic kidney disease stage IV, nonsustained ventricular tachycardia, obstructive sleep apnea, COPD, hypertension, history of status epilepticus, who was admitted to [facility] on 04/04/2020 for ongoing care.  1.  Acute kidney injury/chronic kidney disease stage IV.  When the patient left outside hospital his EGFR was 49.   -Dialysis remains on hold.  As expected BUN rising and currently 134 with a creatinine of 2.2.  2.  Acute respiratory failure.  Patient remains on T-piece and off of the ventilator at the moment.  3.  Anemia of chronic kidney disease.  Hemoglobin currently 9.4.  4.  Hypokalemia.  Serum potassium improved to 3.8 post repletion.  LOS: 0 Antonio Chavez 4/29/20224:16 PM

## 2020-05-06 LAB — CBC
HCT: 32.6 % — ABNORMAL LOW (ref 39.0–52.0)
Hemoglobin: 9.9 g/dL — ABNORMAL LOW (ref 13.0–17.0)
MCH: 31.1 pg (ref 26.0–34.0)
MCHC: 30.4 g/dL (ref 30.0–36.0)
MCV: 102.5 fL — ABNORMAL HIGH (ref 80.0–100.0)
Platelets: 348 10*3/uL (ref 150–400)
RBC: 3.18 MIL/uL — ABNORMAL LOW (ref 4.22–5.81)
RDW: 16.5 % — ABNORMAL HIGH (ref 11.5–15.5)
WBC: 11.2 10*3/uL — ABNORMAL HIGH (ref 4.0–10.5)
nRBC: 0 % (ref 0.0–0.2)

## 2020-05-06 LAB — RENAL FUNCTION PANEL
Albumin: 2 g/dL — ABNORMAL LOW (ref 3.5–5.0)
Anion gap: 7 (ref 5–15)
BUN: 65 mg/dL — ABNORMAL HIGH (ref 8–23)
CO2: 30 mmol/L (ref 22–32)
Calcium: 9.2 mg/dL (ref 8.9–10.3)
Chloride: 99 mmol/L (ref 98–111)
Creatinine, Ser: 1.22 mg/dL (ref 0.61–1.24)
GFR, Estimated: >60 mL/min (ref >60)
Glucose, Bld: 111 mg/dL — ABNORMAL HIGH (ref 70–99)
Phosphorus: 1.4 mg/dL — ABNORMAL LOW (ref 2.5–4.6)
Potassium: 3.1 mmol/L — ABNORMAL LOW (ref 3.5–5.1)
Sodium: 136 mmol/L (ref 135–145)

## 2020-05-07 ENCOUNTER — Other Ambulatory Visit (HOSPITAL_COMMUNITY): Payer: Medicare Other

## 2020-05-07 LAB — POTASSIUM: Potassium: 3.5 mmol/L (ref 3.5–5.1)

## 2020-05-08 NOTE — Progress Notes (Signed)
[facility]  ROUNDING NOTE   Subjective:  It appears of the patient's renal function is recovering. Urine output was 900 cc over the preceding 24 hours. Creatinine down to 1.2.   Objective:  Vital signs in last 24 hours:  Temperature 96.5 pulse 92 respirations 24 blood pressure 171/94  Physical Exam: General:  Chronically ill-appearing  Head:  Normocephalic, atraumatic. Moist oral mucosal membranes  Eyes:  Anicteric  Neck:  Tracheostomy in place, T-piece in place  Lungs:   Coarse breath sounds bilateral, normal effort  Heart:  S1S2 no rubs  Abdomen:   Soft, nontender, bowel sounds present  Extremities:  Trace peripheral edema.  Neurologic:  Awake, alert  Skin:  Bilateral upper extremity ecchymoses  Access:  Right IJ temporary dialysis catheter    Basic Metabolic Panel: Recent Labs  Lab 05/02/20 0817 05/04/20 0832 05/05/20 0842 05/06/20 0800 05/07/20 1113  NA 134* 136  --  136  --   K 3.4* 3.3* 3.8 3.1* 3.5  CL 97* 99  --  99  --   CO2 28 25  --  30  --   GLUCOSE 107* 122*  --  111*  --   BUN 71* 134*  --  65*  --   CREATININE 2.06* 2.22*  --  1.22  --   CALCIUM 9.5 9.6  --  9.2  --   PHOS 2.5 2.5  --  1.4*  --     Liver Function Tests: Recent Labs  Lab 05/02/20 0817 05/04/20 0832 05/06/20 0800  ALBUMIN 1.9* 1.9* 2.0*   No results for input(s): LIPASE, AMYLASE in the last 168 hours. No results for input(s): AMMONIA in the last 168 hours.  CBC: Recent Labs  Lab 05/02/20 0817 05/04/20 0832 05/06/20 0800  WBC 13.2* 10.3 11.2*  HGB 9.5* 9.4* 9.9*  HCT 30.8* 30.6* 32.6*  MCV 102.0* 101.3* 102.5*  PLT 245 279 348    Cardiac Enzymes: No results for input(s): CKTOTAL, CKMB, CKMBINDEX, TROPONINI in the last 168 hours.  BNP: Invalid input(s): POCBNP  CBG: No results for input(s): GLUCAP in the last 168 hours.  Microbiology: Results for orders placed or performed during the hospital encounter of 04/04/20  Culture, Respiratory w Gram  Stain     Status: None   Collection Time: 04/05/20 12:23 PM   Specimen: Tracheal Aspirate; Respiratory  Result Value Ref Range Status   Specimen Description TRACHEAL ASPIRATE  Final   Special Requests NONE  Final   Gram Stain   Final    FEW WBC PRESENT, PREDOMINANTLY PMN ABUNDANT GRAM NEGATIVE RODS FEW GRAM POSITIVE COCCI IN PAIRS IN CLUSTERS Performed at [facility], [address], [place], [place] [address]    Culture ABUNDANT PSEUDOMONAS AERUGINOSA  Final   Report Status 04/07/2020 FINAL  Final   Organism ID, Bacteria PSEUDOMONAS AERUGINOSA  Final      Susceptibility   Pseudomonas aeruginosa - MIC*    CEFTAZIDIME 8 SENSITIVE Sensitive     CIPROFLOXACIN 0.5 SENSITIVE Sensitive     GENTAMICIN <=1 SENSITIVE Sensitive     IMIPENEM <=0.25 SENSITIVE Sensitive     * ABUNDANT PSEUDOMONAS AERUGINOSA  Culture, blood (routine x 2)     Status: None   Collection Time: 04/05/20 12:41 PM   Specimen: BLOOD LEFT HAND  Result Value Ref Range Status   Specimen Description BLOOD LEFT HAND  Final   Special Requests   Final    BOTTLES DRAWN AEROBIC AND ANAEROBIC Blood Culture adequate  volume   Culture   Final    NO GROWTH 6 DAYS Performed at [facility], [place] [address] [place], [place] [address]    Report Status 04/11/2020 FINAL  Final  Culture, blood (routine x 2)     Status: None   Collection Time: 04/05/20 12:50 PM   Specimen: BLOOD RIGHT HAND  Result Value Ref Range Status   Specimen Description BLOOD RIGHT HAND  Final   Special Requests   Final    BOTTLES DRAWN AEROBIC AND ANAEROBIC Blood Culture results may not be optimal due to an inadequate volume of blood received in culture bottles   Culture   Final    NO GROWTH 6 DAYS Performed at [facility], [place] [address], [place], [place] [address]    Report Status 04/11/2020 FINAL  Final  Culture, Respiratory w Gram Stain     Status: None   Collection Time: 04/18/20  1:18 PM   Specimen: Tracheal Aspirate;  Respiratory  Result Value Ref Range Status   Specimen Description TRACHEAL ASPIRATE  Final   Special Requests NONE  Final   Gram Stain   Final    FEW WBC PRESENT, PREDOMINANTLY PMN ABUNDANT GRAM NEGATIVE RODS RARE GRAM POSITIVE COCCI Performed at [facility], [place] [address], [place], [place] [address]    Culture ABUNDANT PSEUDOMONAS AERUGINOSA  Final   Report Status 04/21/2020 FINAL  Final   Organism ID, Bacteria PSEUDOMONAS AERUGINOSA  Final      Susceptibility   Pseudomonas aeruginosa - MIC*    CEFTAZIDIME 8 SENSITIVE Sensitive     CIPROFLOXACIN 1 SENSITIVE Sensitive     GENTAMICIN 4 SENSITIVE Sensitive     IMIPENEM <=0.25 SENSITIVE Sensitive     * ABUNDANT PSEUDOMONAS AERUGINOSA  Culture, Urine     Status: Abnormal   Collection Time: 04/18/20  1:19 PM   Specimen: Urine, Random  Result Value Ref Range Status   Specimen Description URINE, RANDOM  Final   Special Requests NONE  Final   Culture (A)  Final    <10,000 COLONIES/mL INSIGNIFICANT GROWTH Performed at [facility], [place] [address] [place], [place] [address]    Report Status 04/19/2020 FINAL  Final  Culture, blood (routine x 2)     Status: None   Collection Time: 04/18/20  1:30 PM   Specimen: BLOOD  Result Value Ref Range Status   Specimen Description BLOOD RIGHT ANTECUBITAL  Final   Special Requests   Final    BOTTLES DRAWN AEROBIC ONLY Blood Culture results may not be optimal due to an excessive volume of blood received in culture bottles   Culture   Final    NO GROWTH 5 DAYS Performed at [facility], [place] [address] [place],  [address]    Report Status 04/23/2020 FINAL  Final  Culture, blood (routine x 2)     Status: None   Collection Time: 04/18/20  1:43 PM   Specimen: BLOOD  Result Value Ref Range Status   Specimen Description BLOOD BLOOD RIGHT FOREARM  Final   Special Requests   Final    BOTTLES DRAWN AEROBIC AND ANAEROBIC Blood Culture adequate volume   Culture   Final    NO  GROWTH 5 DAYS Performed at [facility], [place] [address], [place],  [address]    Report Status 04/23/2020 FINAL  Final  Culture, Respiratory w Gram Stain     Status: None (Preliminary result)  Collection Time: 05/07/20 10:29 AM   Specimen: Tracheal Aspirate; Respiratory  Result Value Ref Range Status   Specimen Description TRACHEAL ASPIRATE  Final   Special Requests NONE  Final   Gram Stain   Final    RARE WBC PRESENT,BOTH PMN AND MONONUCLEAR FEW GRAM NEGATIVE RODS RARE GRAM POSITIVE COCCI IN PAIRS Performed at [facility], [place] [address], [place], [place] [address]    Culture PENDING  Incomplete   Report Status PENDING  Incomplete    Coagulation Studies: No results for input(s): LABPROT, INR in the last 72 hours.  Urinalysis: No results for input(s): COLORURINE, LABSPEC, PHURINE, GLUCOSEU, HGBUR, BILIRUBINUR, KETONESUR, PROTEINUR, UROBILINOGEN, NITRITE, LEUKOCYTESUR in the last 72 hours.  Invalid input(s): APPERANCEUR    Imaging: DG CHEST PORT 1 VIEW  Result Date: 05/07/2020 CLINICAL DATA:  Respiratory failure, history of COPD EXAM: PORTABLE CHEST 1 VIEW COMPARISON:  Radiograph 04/18/2020 CT abdomen and pelvis 05/01/2020 FINDINGS: Endotracheal tube tip terminates near the mid trachea approximately 4.7 cm from the carina. Transesophageal tube tip and side port distal to the GE junction. Dual lumen right IJ approach central venous catheter tip terminates at the right atrium. Telemetry leads and external support devices overlie the chest. Low lung volumes and atelectatic changes with additional scarring and bandlike opacities throughout both lungs. Chronically coarsened interstitial changes are also similar to comparison. No new focal consolidative opacity, pneumothorax or layering effusion. Stable cardiomediastinal contours with a calcified aorta. No acute osseous or soft tissue abnormality. IMPRESSION: Lines and tubes as above. Stable scarring and chronically  coarsened interstitial changes. Low volumes and atelectasis. Similar appearance to most recent comparison. Electronically Signed   By: Lovena Le M.D.   On: 05/07/2020 06:46     Medications:       Assessment/ Plan:  60 y.o. male with a PMHx of acute on chronic respiratory failure status post tracheostomy placement, paroxysmal atrial fibrillation, acute diastolic heart failure, chronic kidney disease stage IV, nonsustained ventricular tachycardia, obstructive sleep apnea, COPD, hypertension, history of status epilepticus, who was admitted to [facility] on 04/04/2020 for ongoing care.  1.  Acute kidney injury/chronic kidney disease stage IV.  When the patient left outside hospital his EGFR was 49.   -We previously placed dialysis on hold.  However now it appears that the patient's renal function is actually improving.  Creatinine down to 1.2 with urine output of 900 cc of the preceding 24 hours.  Therefore no further need for dialysis.  Consider removing temporary dialysis catheter Wednesday.  2.  Acute respiratory failure.  Still has coarse breath sounds on exam.  Requires frequent suctioning.  3.  Anemia of chronic kidney disease.  Hemoglobin 9.9 and close to target.  4.  Hypokalemia.  Potassium was 3.1 but up to 3.5 post repletion.  LOS: 0 Jet Armbrust 5/2/20228:14 AM

## 2020-05-09 LAB — RENAL FUNCTION PANEL
Albumin: 2.1 g/dL — ABNORMAL LOW (ref 3.5–5.0)
Anion gap: 14 (ref 5–15)
BUN: 85 mg/dL — ABNORMAL HIGH (ref 8–23)
CO2: 23 mmol/L (ref 22–32)
Calcium: 10 mg/dL (ref 8.9–10.3)
Chloride: 97 mmol/L — ABNORMAL LOW (ref 98–111)
Creatinine, Ser: 1.84 mg/dL — ABNORMAL HIGH (ref 0.61–1.24)
GFR, Estimated: 41 mL/min — ABNORMAL LOW (ref >60)
Glucose, Bld: 118 mg/dL — ABNORMAL HIGH (ref 70–99)
Phosphorus: 3.1 mg/dL (ref 2.5–4.6)
Potassium: 3.8 mmol/L (ref 3.5–5.1)
Sodium: 134 mmol/L — ABNORMAL LOW (ref 135–145)

## 2020-05-09 LAB — CBC
HCT: 39.1 % (ref 39.0–52.0)
Hemoglobin: 11.9 g/dL — ABNORMAL LOW (ref 13.0–17.0)
MCH: 31.2 pg (ref 26.0–34.0)
MCHC: 30.4 g/dL (ref 30.0–36.0)
MCV: 102.4 fL — ABNORMAL HIGH (ref 80.0–100.0)
Platelets: 361 10*3/uL (ref 150–400)
RBC: 3.82 MIL/uL — ABNORMAL LOW (ref 4.22–5.81)
RDW: 17 % — ABNORMAL HIGH (ref 11.5–15.5)
WBC: 16.3 10*3/uL — ABNORMAL HIGH (ref 4.0–10.5)
nRBC: 0 % (ref 0.0–0.2)

## 2020-05-10 ENCOUNTER — Other Ambulatory Visit (HOSPITAL_COMMUNITY): Payer: Medicare Other

## 2020-05-10 DIAGNOSIS — J449 Chronic obstructive pulmonary disease, unspecified: Secondary | ICD-10-CM | POA: Diagnosis not present

## 2020-05-10 DIAGNOSIS — J9621 Acute and chronic respiratory failure with hypoxia: Secondary | ICD-10-CM | POA: Diagnosis not present

## 2020-05-10 DIAGNOSIS — N184 Chronic kidney disease, stage 4 (severe): Secondary | ICD-10-CM | POA: Diagnosis not present

## 2020-05-10 DIAGNOSIS — I5032 Chronic diastolic (congestive) heart failure: Secondary | ICD-10-CM | POA: Diagnosis not present

## 2020-05-10 NOTE — Progress Notes (Signed)
Pulmonary Critical Care Medicine [place]  PROGRESS NOTE     Enrique Raveling  [id number]  DOB: 25-Jun-1960   DOA: 04/04/2020  Referring Physician: Merton Border, MD  HPI: Raymond Kallay is a 60 y.o. male seen for follow up of Acute on Chronic Respiratory Failure.  Currently patient is afebrile review seems to be resting comfortably without distress at this time.  Has been on T-piece on 28% FiO2  Medications: Reviewed on Rounds  Physical Exam:  Vitals: Temperature is 98 pulse 86 respiratory 28 blood pressure 179/99 saturations 95%  Ventilator Settings scattered rhonchi expansion is equal  . General: Comfortable at this time . Eyes: Grossly normal lids, irises & conjunctiva . ENT: grossly tongue is normal . Neck: no obvious mass . Cardiovascular: S1 S2 normal no gallop . Respiratory: Scattered rhonchi are noted bilaterally . Abdomen: soft . Skin: no rash seen on limited exam . Musculoskeletal: not rigid . Psychiatric:unable to assess . Neurologic: no seizure no involuntary movements         Lab Data:   Basic Metabolic Panel: Recent Labs  Lab 05/04/20 0832 05/05/20 0842 05/06/20 0800 05/07/20 1113 05/09/20 0640  NA 136  --  136  --  134*  K 3.3* 3.8 3.1* 3.5 3.8  CL 99  --  99  --  97*  CO2 25  --  30  --  23  GLUCOSE 122*  --  111*  --  118*  BUN 134*  --  65*  --  85*  CREATININE 2.22*  --  1.22  --  1.84*  CALCIUM 9.6  --  9.2  --  10.0  PHOS 2.5  --  1.4*  --  3.1    ABG: Recent Labs  Lab 05/05/20 0807  PHART 7.351  PCO2ART 53.4*  PO2ART 85.1  HCO3 29.1*  O2SAT 97.4    Liver Function Tests: Recent Labs  Lab 05/04/20 0832 05/06/20 0800 05/09/20 0640  ALBUMIN 1.9* 2.0* 2.1*   No results for input(s): LIPASE, AMYLASE in the last 168 hours. No results for input(s): AMMONIA in the last 168 hours.  CBC: Recent Labs  Lab 05/04/20 0832 05/06/20 0800 05/09/20 0640  WBC 10.3 11.2* 16.3*   HGB 9.4* 9.9* 11.9*  HCT 30.6* 32.6* 39.1  MCV 101.3* 102.5* 102.4*  PLT 279 348 361    Cardiac Enzymes: No results for input(s): CKTOTAL, CKMB, CKMBINDEX, TROPONINI in the last 168 hours.  BNP (last 3 results) No results for input(s): BNP in the last 8760 hours.  ProBNP (last 3 results) No results for input(s): PROBNP in the last 8760 hours.  Radiological Exams: No results found.  Assessment/Plan Active Problems:   Acute on chronic respiratory failure with hypoxia (HCC)   Chronic kidney disease, stage IV (severe) (HCC)   Paroxysmal atrial fibrillation (HCC)   COPD, severe (HCC)   Chronic heart failure with preserved ejection fraction ([place])   1. Acute on chronic respiratory failure with hypoxia patient currently is on T-piece 28% FiO2 change the trach over to a #6 cuffless 2. Chronic kidney disease stage IV we will continue to monitor closely. 3. Paroxysmal atrial fibrillation rate controlled 4. Severe COPD medical management nebulizers as necessary 5. Chronic heart failure preserved ejection fraction we will continue to monitor patient's fluid status   I have personally seen and evaluated the patient, evaluated laboratory and imaging results, formulated the assessment and plan and placed orders. The Patient requires high complexity  decision making with multiple systems involvement.  Rounds were done with the Respiratory Therapy Director and Staff therapists and discussed with nursing staff also.  Allyne Gee, MD [facility] Pulmonary Critical Care Medicine Sleep Medicine

## 2020-05-11 DIAGNOSIS — J449 Chronic obstructive pulmonary disease, unspecified: Secondary | ICD-10-CM | POA: Diagnosis not present

## 2020-05-11 DIAGNOSIS — I5032 Chronic diastolic (congestive) heart failure: Secondary | ICD-10-CM | POA: Diagnosis not present

## 2020-05-11 DIAGNOSIS — N184 Chronic kidney disease, stage 4 (severe): Secondary | ICD-10-CM | POA: Diagnosis not present

## 2020-05-11 DIAGNOSIS — J9621 Acute and chronic respiratory failure with hypoxia: Secondary | ICD-10-CM | POA: Diagnosis not present

## 2020-05-11 LAB — CULTURE, RESPIRATORY W GRAM STAIN

## 2020-05-11 NOTE — Progress Notes (Signed)
Pulmonary Critical Care Medicine [place]  PROGRESS NOTE     Antonio Chavez  [id number]  DOB: 1960/10/08   DOA: 04/04/2020  Referring Physician: Merton Border, MD  HPI: Antonio Chavez is a 60 y.o. male seen for follow up of Acute on Chronic Respiratory Failure.  Patient is afebrile right now seems to be comfortable without distress has been doing well with capping trials  Medications: Reviewed on Rounds  Physical Exam:  Vitals: Temperature is 98 pulse 106 respiratory rate 30 blood pressure is 148/86 saturations 100%  Ventilator Settings capping off the ventilator on 2 L  . General: Comfortable at this time . Eyes: Grossly normal lids, irises & conjunctiva . ENT: grossly tongue is normal . Neck: no obvious mass . Cardiovascular: S1 S2 normal no gallop . Respiratory: No rhonchi no rales are noted at this time . Abdomen: soft . Skin: no rash seen on limited exam . Musculoskeletal: not rigid . Psychiatric:unable to assess . Neurologic: no seizure no involuntary movements         Lab Data:   Basic Metabolic Panel: Recent Labs  Lab 05/05/20 0842 05/06/20 0800 05/07/20 1113 05/09/20 0640  NA  --  136  --  134*  K 3.8 3.1* 3.5 3.8  CL  --  99  --  97*  CO2  --  30  --  23  GLUCOSE  --  111*  --  118*  BUN  --  65*  --  85*  CREATININE  --  1.22  --  1.84*  CALCIUM  --  9.2  --  10.0  PHOS  --  1.4*  --  3.1    ABG: Recent Labs  Lab 05/05/20 0807  PHART 7.351  PCO2ART 53.4*  PO2ART 85.1  HCO3 29.1*  O2SAT 97.4    Liver Function Tests: Recent Labs  Lab 05/06/20 0800 05/09/20 0640  ALBUMIN 2.0* 2.1*   No results for input(s): LIPASE, AMYLASE in the last 168 hours. No results for input(s): AMMONIA in the last 168 hours.  CBC: Recent Labs  Lab 05/06/20 0800 05/09/20 0640  WBC 11.2* 16.3*  HGB 9.9* 11.9*  HCT 32.6* 39.1  MCV 102.5* 102.4*  PLT 348 361    Cardiac Enzymes: No results for  input(s): CKTOTAL, CKMB, CKMBINDEX, TROPONINI in the last 168 hours.  BNP (last 3 results) No results for input(s): BNP in the last 8760 hours.  ProBNP (last 3 results) No results for input(s): PROBNP in the last 8760 hours.  Radiological Exams: No results found.  Assessment/Plan Active Problems:   Acute on chronic respiratory failure with hypoxia (HCC)   Chronic kidney disease, stage IV (severe) (HCC)   Paroxysmal atrial fibrillation (HCC)   COPD, severe (HCC)   Chronic heart failure with preserved ejection fraction ([place])   1. Acute on chronic respiratory failure with hypoxia plan is to continue with capping trials as tolerated making some progress we will continue to build on the progress 2. Chronic kidney disease stage IV we will continue to monitor patient's labs closely nephrology is following along 3. Paroxysmal atrial fibrillation rate is controlled 4. Severe COPD medical management nebulizers as necessary 5. Chronic heart failure preserved ejection fraction we will continue to monitor closely   I have personally seen and evaluated the patient, evaluated laboratory and imaging results, formulated the assessment and plan and placed orders. The Patient requires high complexity decision making with multiple systems involvement.  Rounds  were done with the Respiratory Therapy Director and Staff therapists and discussed with nursing staff also.  Allyne Gee, MD [facility] Pulmonary Critical Care Medicine Sleep Medicine

## 2020-05-12 DIAGNOSIS — J9621 Acute and chronic respiratory failure with hypoxia: Secondary | ICD-10-CM | POA: Diagnosis not present

## 2020-05-12 DIAGNOSIS — I5032 Chronic diastolic (congestive) heart failure: Secondary | ICD-10-CM | POA: Diagnosis not present

## 2020-05-12 DIAGNOSIS — J449 Chronic obstructive pulmonary disease, unspecified: Secondary | ICD-10-CM | POA: Diagnosis not present

## 2020-05-12 DIAGNOSIS — N184 Chronic kidney disease, stage 4 (severe): Secondary | ICD-10-CM | POA: Diagnosis not present

## 2020-05-12 NOTE — Progress Notes (Signed)
Pulmonary Critical Care Medicine [place]  PROGRESS NOTE     Antonio Chavez  [id number]  DOB: September 23, 1960   DOA: 04/04/2020  Referring Physician: Merton Border, MD  HPI: Antonio Chavez is a 60 y.o. male seen for follow up of Acute on Chronic Respiratory Failure.  Doing well with capping will be completing 48 hours to be ready for decannulation  Medications: Reviewed on Rounds  Physical Exam:  Vitals: Temperature is 96.4 pulse 98 respiratory 22 blood pressure is 162/74 saturations 99%  Ventilator Settings capping goal of 48 hours and decannulate  . General: Comfortable at this time . Eyes: Grossly normal lids, irises & conjunctiva . ENT: grossly tongue is normal . Neck: no obvious mass . Cardiovascular: S1 S2 normal no gallop . Respiratory: No rhonchi no rales are noted at this time . Abdomen: soft . Skin: no rash seen on limited exam . Musculoskeletal: not rigid . Psychiatric:unable to assess . Neurologic: no seizure no involuntary movements         Lab Data:   Basic Metabolic Panel: Recent Labs  Lab 05/06/20 0800 05/07/20 1113 05/09/20 0640  NA 136  --  134*  K 3.1* 3.5 3.8  CL 99  --  97*  CO2 30  --  23  GLUCOSE 111*  --  118*  BUN 65*  --  85*  CREATININE 1.22  --  1.84*  CALCIUM 9.2  --  10.0  PHOS 1.4*  --  3.1    ABG: No results for input(s): PHART, PCO2ART, PO2ART, HCO3, O2SAT in the last 168 hours.  Liver Function Tests: Recent Labs  Lab 05/06/20 0800 05/09/20 0640  ALBUMIN 2.0* 2.1*   No results for input(s): LIPASE, AMYLASE in the last 168 hours. No results for input(s): AMMONIA in the last 168 hours.  CBC: Recent Labs  Lab 05/06/20 0800 05/09/20 0640  WBC 11.2* 16.3*  HGB 9.9* 11.9*  HCT 32.6* 39.1  MCV 102.5* 102.4*  PLT 348 361    Cardiac Enzymes: No results for input(s): CKTOTAL, CKMB, CKMBINDEX, TROPONINI in the last 168 hours.  BNP (last 3 results) No results  for input(s): BNP in the last 8760 hours.  ProBNP (last 3 results) No results for input(s): PROBNP in the last 8760 hours.  Radiological Exams: No results found.  Assessment/Plan Active Problems:   Acute on chronic respiratory failure with hypoxia (HCC)   Chronic kidney disease, stage IV (severe) (HCC)   Paroxysmal atrial fibrillation (HCC)   COPD, severe (HCC)   Chronic heart failure with preserved ejection fraction ([place])   1. Acute on chronic respiratory failure with hypoxia patient is ready for decannulation today 2. Chronic kidney disease stage IV supportive care followed by nephrology 3. Paroxysmal atrial fibrillation rate controlled 4. Severe COPD medical management 5. Congestive heart failure preserved ejection fraction monitor patient's fluid status   I have personally seen and evaluated the patient, evaluated laboratory and imaging results, formulated the assessment and plan and placed orders. The Patient requires high complexity decision making with multiple systems involvement.  Rounds were done with the Respiratory Therapy Director and Staff therapists and discussed with nursing staff also.  Allyne Gee, MD [facility] Pulmonary Critical Care Medicine Sleep Medicine

## 2020-05-13 DIAGNOSIS — I5032 Chronic diastolic (congestive) heart failure: Secondary | ICD-10-CM | POA: Diagnosis not present

## 2020-05-13 DIAGNOSIS — J9621 Acute and chronic respiratory failure with hypoxia: Secondary | ICD-10-CM | POA: Diagnosis not present

## 2020-05-13 DIAGNOSIS — J449 Chronic obstructive pulmonary disease, unspecified: Secondary | ICD-10-CM | POA: Diagnosis not present

## 2020-05-13 DIAGNOSIS — N184 Chronic kidney disease, stage 4 (severe): Secondary | ICD-10-CM | POA: Diagnosis not present

## 2020-05-13 LAB — RENAL FUNCTION PANEL
Albumin: 1.9 g/dL — ABNORMAL LOW (ref 3.5–5.0)
Anion gap: 6 (ref 5–15)
BUN: 45 mg/dL — ABNORMAL HIGH (ref 8–23)
CO2: 28 mmol/L (ref 22–32)
Calcium: 7.9 mg/dL — ABNORMAL LOW (ref 8.9–10.3)
Chloride: 105 mmol/L (ref 98–111)
Creatinine, Ser: 2.02 mg/dL — ABNORMAL HIGH (ref 0.61–1.24)
GFR, Estimated: 37 mL/min — ABNORMAL LOW (ref >60)
Glucose, Bld: 122 mg/dL — ABNORMAL HIGH (ref 70–99)
Phosphorus: 3.2 mg/dL (ref 2.5–4.6)
Potassium: 3.6 mmol/L (ref 3.5–5.1)
Sodium: 139 mmol/L (ref 135–145)

## 2020-05-13 LAB — CBC
HCT: 31 % — ABNORMAL LOW (ref 39.0–52.0)
Hemoglobin: 9.2 g/dL — ABNORMAL LOW (ref 13.0–17.0)
MCH: 31.1 pg (ref 26.0–34.0)
MCHC: 29.7 g/dL — ABNORMAL LOW (ref 30.0–36.0)
MCV: 104.7 fL — ABNORMAL HIGH (ref 80.0–100.0)
Platelets: 240 10*3/uL (ref 150–400)
RBC: 2.96 MIL/uL — ABNORMAL LOW (ref 4.22–5.81)
RDW: 16.5 % — ABNORMAL HIGH (ref 11.5–15.5)
WBC: 10.9 10*3/uL — ABNORMAL HIGH (ref 4.0–10.5)
nRBC: 0 % (ref 0.0–0.2)

## 2020-05-13 NOTE — Progress Notes (Signed)
Pulmonary Critical Care Medicine [place]  PROGRESS NOTE     Antonio Chavez  [id number]  DOB: 10-Sep-1960   DOA: 04/04/2020  Referring Physician: Merton Border, MD  HPI: Antonio Chavez is a 60 y.o. male seen for follow up of Acute on Chronic Respiratory Failure.  Doing well using the trilogy has been decannulated without any issues  Medications: Reviewed on Rounds  Physical Exam:  Vitals: Temperature is 98.8 pulse 76 respiratory 30 blood pressure is 176/80 saturations 98%  Ventilator Settings decannulated  . General: Comfortable at this time . Eyes: Grossly normal lids, irises & conjunctiva . ENT: grossly tongue is normal . Neck: no obvious mass . Cardiovascular: S1 S2 normal no gallop . Respiratory: Scattered rhonchi expansion is equal . Abdomen: soft . Skin: no rash seen on limited exam . Musculoskeletal: not rigid . Psychiatric:unable to assess . Neurologic: no seizure no involuntary movements         Lab Data:   Basic Metabolic Panel: Recent Labs  Lab 05/07/20 1113 05/09/20 0640 05/13/20 0432  NA  --  134* 139  K 3.5 3.8 3.6  CL  --  97* 105  CO2  --  23 28  GLUCOSE  --  118* 122*  BUN  --  85* 45*  CREATININE  --  1.84* 2.02*  CALCIUM  --  10.0 7.9*  PHOS  --  3.1 3.2    ABG: No results for input(s): PHART, PCO2ART, PO2ART, HCO3, O2SAT in the last 168 hours.  Liver Function Tests: Recent Labs  Lab 05/09/20 0640 05/13/20 0432  ALBUMIN 2.1* 1.9*   No results for input(s): LIPASE, AMYLASE in the last 168 hours. No results for input(s): AMMONIA in the last 168 hours.  CBC: Recent Labs  Lab 05/09/20 0640 05/13/20 0432  WBC 16.3* 10.9*  HGB 11.9* 9.2*  HCT 39.1 31.0*  MCV 102.4* 104.7*  PLT 361 240    Cardiac Enzymes: No results for input(s): CKTOTAL, CKMB, CKMBINDEX, TROPONINI in the last 168 hours.  BNP (last 3 results) No results for input(s): BNP in the last 8760  hours.  ProBNP (last 3 results) No results for input(s): PROBNP in the last 8760 hours.  Radiological Exams: No results found.  Assessment/Plan Active Problems:   Acute on chronic respiratory failure with hypoxia (HCC)   Chronic kidney disease, stage IV (severe) (HCC)   Paroxysmal atrial fibrillation (HCC)   COPD, severe (HCC)   Chronic heart failure with preserved ejection fraction ([place])   1. Acute on chronic respiratory failure hypoxia doing well after decannulation we will continue with supportive care 2. Chronic kidney disease stage IV being seen by nephrology 3. Paroxysmal atrial fibrillation rate controlled 4. Severe COPD medical management 5. Chronic heart failure preserved ejection fraction continue with supportive care   I have personally seen and evaluated the patient, evaluated laboratory and imaging results, formulated the assessment and plan and placed orders. The Patient requires high complexity decision making with multiple systems involvement.  Rounds were done with the Respiratory Therapy Director and Staff therapists and discussed with nursing staff also.  Allyne Gee, MD [facility] Pulmonary Critical Care Medicine Sleep Medicine

## 2020-05-15 ENCOUNTER — Other Ambulatory Visit (HOSPITAL_COMMUNITY): Payer: Medicare Other

## 2020-05-15 LAB — CBC
HCT: 31.1 % — ABNORMAL LOW (ref 39.0–52.0)
Hemoglobin: 9.1 g/dL — ABNORMAL LOW (ref 13.0–17.0)
MCH: 31 pg (ref 26.0–34.0)
MCHC: 29.3 g/dL — ABNORMAL LOW (ref 30.0–36.0)
MCV: 105.8 fL — ABNORMAL HIGH (ref 80.0–100.0)
Platelets: 248 10*3/uL (ref 150–400)
RBC: 2.94 MIL/uL — ABNORMAL LOW (ref 4.22–5.81)
RDW: 16.3 % — ABNORMAL HIGH (ref 11.5–15.5)
WBC: 10 10*3/uL (ref 4.0–10.5)
nRBC: 0 % (ref 0.0–0.2)

## 2020-05-15 LAB — BASIC METABOLIC PANEL
Anion gap: 6 (ref 5–15)
BUN: 39 mg/dL — ABNORMAL HIGH (ref 8–23)
CO2: 25 mmol/L (ref 22–32)
Calcium: 7.4 mg/dL — ABNORMAL LOW (ref 8.9–10.3)
Chloride: 106 mmol/L (ref 98–111)
Creatinine, Ser: 1.79 mg/dL — ABNORMAL HIGH (ref 0.61–1.24)
GFR, Estimated: 43 mL/min — ABNORMAL LOW (ref >60)
Glucose, Bld: 129 mg/dL — ABNORMAL HIGH (ref 70–99)
Potassium: 3.6 mmol/L (ref 3.5–5.1)
Sodium: 137 mmol/L (ref 135–145)

## 2020-05-15 NOTE — Progress Notes (Signed)
[facility]  ROUNDING NOTE   Subjective:   Urine output was 1.07 L over the preceding 24 hours. Creatinine down to 1.79.   Objective:  Vital signs in last 24 hours:  Temperature 97.8 pulse 94 respirations 32 blood pressure 120/68  Physical Exam: General:  Chronically ill-appearing  Head:  Normocephalic, atraumatic. Moist oral mucosal membranes  Eyes:  Anicteric  Neck:  Tracheostomy in place, T-piece in place  Lungs:   Coarse breath sounds bilateral, normal effort  Heart:  S1S2 no rubs  Abdomen:   Soft, nontender, bowel sounds present  Extremities:  Trace peripheral edema.  Neurologic:  Awake, alert, conversant  Skin:  Bilateral upper extremity ecchymoses       Basic Metabolic Panel: Recent Labs  Lab 05/09/20 0640 05/13/20 0432 05/15/20 0914  NA 134* 139 137  K 3.8 3.6 3.6  CL 97* 105 106  CO2 23 28 25   GLUCOSE 118* 122* 129*  BUN 85* 45* 39*  CREATININE 1.84* 2.02* 1.79*  CALCIUM 10.0 7.9* 7.4*  PHOS 3.1 3.2  --     Liver Function Tests: Recent Labs  Lab 05/09/20 0640 05/13/20 0432  ALBUMIN 2.1* 1.9*   No results for input(s): LIPASE, AMYLASE in the last 168 hours. No results for input(s): AMMONIA in the last 168 hours.  CBC: Recent Labs  Lab 05/09/20 0640 05/13/20 0432 05/15/20 0914  WBC 16.3* 10.9* 10.0  HGB 11.9* 9.2* 9.1*  HCT 39.1 31.0* 31.1*  MCV 102.4* 104.7* 105.8*  PLT 361 240 248    Cardiac Enzymes: No results for input(s): CKTOTAL, CKMB, CKMBINDEX, TROPONINI in the last 168 hours.  BNP: Invalid input(s): POCBNP  CBG: No results for input(s): GLUCAP in the last 168 hours.  Microbiology: Results for orders placed or performed during the hospital encounter of 04/04/20  Culture, Respiratory w Gram Stain     Status: None   Collection Time: 04/05/20 12:23 PM   Specimen: Tracheal Aspirate; Respiratory  Result Value Ref Range Status   Specimen Description TRACHEAL ASPIRATE  Final   Special Requests NONE  Final   Gram  Stain   Final    FEW WBC PRESENT, PREDOMINANTLY PMN ABUNDANT GRAM NEGATIVE RODS FEW GRAM POSITIVE COCCI IN PAIRS IN CLUSTERS Performed at [facility], [address], [place], [place] [address]    Culture ABUNDANT PSEUDOMONAS AERUGINOSA  Final   Report Status 04/07/2020 FINAL  Final   Organism ID, Bacteria PSEUDOMONAS AERUGINOSA  Final      Susceptibility   Pseudomonas aeruginosa - MIC*    CEFTAZIDIME 8 SENSITIVE Sensitive     CIPROFLOXACIN 0.5 SENSITIVE Sensitive     GENTAMICIN <=1 SENSITIVE Sensitive     IMIPENEM <=0.25 SENSITIVE Sensitive     * ABUNDANT PSEUDOMONAS AERUGINOSA  Culture, blood (routine x 2)     Status: None   Collection Time: 04/05/20 12:41 PM   Specimen: BLOOD LEFT HAND  Result Value Ref Range Status   Specimen Description BLOOD LEFT HAND  Final   Special Requests   Final    BOTTLES DRAWN AEROBIC AND ANAEROBIC Blood Culture adequate volume   Culture   Final    NO GROWTH 6 DAYS Performed at [facility], [address], [place], [place] [address]    Report Status 04/11/2020 FINAL  Final  Culture, blood (routine x 2)     Status: None   Collection Time: 04/05/20 12:50 PM   Specimen: BLOOD RIGHT HAND  Result Value Ref Range Status  Specimen Description BLOOD RIGHT HAND  Final   Special Requests   Final    BOTTLES DRAWN AEROBIC AND ANAEROBIC Blood Culture results may not be optimal due to an inadequate volume of blood received in culture bottles   Culture   Final    NO GROWTH 6 DAYS Performed at [facility], [place] [address] [place], [place] [address]    Report Status 04/11/2020 FINAL  Final  Culture, Respiratory w Gram Stain     Status: None   Collection Time: 04/18/20  1:18 PM   Specimen: Tracheal Aspirate; Respiratory  Result Value Ref Range Status   Specimen Description TRACHEAL ASPIRATE  Final   Special Requests NONE  Final   Gram Stain   Final    FEW WBC PRESENT, PREDOMINANTLY PMN ABUNDANT GRAM NEGATIVE RODS RARE GRAM POSITIVE  COCCI Performed at [facility], [place] [address] [place], [place] [address]    Culture ABUNDANT PSEUDOMONAS AERUGINOSA  Final   Report Status 04/21/2020 FINAL  Final   Organism ID, Bacteria PSEUDOMONAS AERUGINOSA  Final      Susceptibility   Pseudomonas aeruginosa - MIC*    CEFTAZIDIME 8 SENSITIVE Sensitive     CIPROFLOXACIN 1 SENSITIVE Sensitive     GENTAMICIN 4 SENSITIVE Sensitive     IMIPENEM <=0.25 SENSITIVE Sensitive     * ABUNDANT PSEUDOMONAS AERUGINOSA  Culture, Urine     Status: Abnormal   Collection Time: 04/18/20  1:19 PM   Specimen: Urine, Random  Result Value Ref Range Status   Specimen Description URINE, RANDOM  Final   Special Requests NONE  Final   Culture (A)  Final    <10,000 COLONIES/mL INSIGNIFICANT GROWTH Performed at [facility], [place] [address], [place], [place] [address]    Report Status 04/19/2020 FINAL  Final  Culture, blood (routine x 2)     Status: None   Collection Time: 04/18/20  1:30 PM   Specimen: BLOOD  Result Value Ref Range Status   Specimen Description BLOOD RIGHT ANTECUBITAL  Final   Special Requests   Final    BOTTLES DRAWN AEROBIC ONLY Blood Culture results may not be optimal due to an excessive volume of blood received in culture bottles   Culture   Final    NO GROWTH 5 DAYS Performed at [facility], [place] [address] [place], [place] [address]    Report Status 04/23/2020 FINAL  Final  Culture, blood (routine x 2)     Status: None   Collection Time: 04/18/20  1:43 PM   Specimen: BLOOD  Result Value Ref Range Status   Specimen Description BLOOD BLOOD RIGHT FOREARM  Final   Special Requests   Final    BOTTLES DRAWN AEROBIC AND ANAEROBIC Blood Culture adequate volume   Culture   Final    NO GROWTH 5 DAYS Performed at [facility], [place] [address] [place], [place] [address]    Report Status 04/23/2020 FINAL  Final  Culture, Respiratory w Gram Stain     Status: None   Collection Time: 05/07/20 10:29 AM    Specimen: Tracheal Aspirate; Respiratory  Result Value Ref Range Status   Specimen Description TRACHEAL ASPIRATE  Final   Special Requests NONE  Final   Gram Stain   Final    RARE WBC PRESENT,BOTH PMN AND MONONUCLEAR FEW GRAM NEGATIVE RODS RARE GRAM POSITIVE COCCI IN PAIRS Performed at [facility], [place] [address] [place],  [address]  Culture   Final    MODERATE PSEUDOMONAS AERUGINOSA MODERATE STENOTROPHOMONAS MALTOPHILIA    Report Status 05/11/2020 FINAL  Final   Organism ID, Bacteria STENOTROPHOMONAS MALTOPHILIA  Final   Organism ID, Bacteria PSEUDOMONAS AERUGINOSA  Final      Susceptibility   Pseudomonas aeruginosa - MIC*    CEFTAZIDIME 4 SENSITIVE Sensitive     CIPROFLOXACIN 1 SENSITIVE Sensitive     GENTAMICIN 4 SENSITIVE Sensitive     IMIPENEM <=0.25 SENSITIVE Sensitive     * MODERATE PSEUDOMONAS AERUGINOSA   Stenotrophomonas maltophilia - MIC*    LEVOFLOXACIN 0.25 SENSITIVE Sensitive     TRIMETH/SULFA <=20 SENSITIVE Sensitive     * MODERATE STENOTROPHOMONAS MALTOPHILIA    Coagulation Studies: No results for input(s): LABPROT, INR in the last 72 hours.  Urinalysis: No results for input(s): COLORURINE, LABSPEC, PHURINE, GLUCOSEU, HGBUR, BILIRUBINUR, KETONESUR, PROTEINUR, UROBILINOGEN, NITRITE, LEUKOCYTESUR in the last 72 hours.  Invalid input(s): APPERANCEUR    Imaging: No results found.   Medications:       Assessment/ Plan:  60 y.o. male with a PMHx of acute on chronic respiratory failure status post tracheostomy placement, paroxysmal atrial fibrillation, acute diastolic heart failure, chronic kidney disease stage IV, nonsustained ventricular tachycardia, obstructive sleep apnea, COPD, hypertension, history of status epilepticus, who was admitted to [facility] on 04/04/2020 for ongoing care.  1.  Acute kidney injury/chronic kidney disease stage IV.  When the patient left outside hospital his EGFR was 49.   -Considerable renal function  improvement noted.  BUN down to 39 with a creatinine of 1.7.  1 L of urine output over the preceding 24 hours.  No further indication for dialysis.  2.  Acute respiratory failure.  Respiratory status significantly improved since we last saw him.  3.  Anemia of chronic kidney disease.  Hemoglobin close to target at 9.1.  Continue to periodically monitor CBC.  4.  Hypokalemia.  Now resolved, serum potassium 3.6.   LOS: 0 Taran Hable 5/9/20227:26 PM

## 2020-05-15 NOTE — Procedures (Signed)
PROCEDURE SUMMARY:  Successful removal of right IJ non-tunneled HD catheter, removed intact. No immediate complications.  EBL < 2 mL. Pressure dressing (gauze + tegaderm) applied to site. Patient tolerated well.   Discharge instructions: 1- Ok to shower 48 hours post-removal. 2- No submerging (swimming, bathing) for 7 days post-removal. 3- Ok to remove bandage after first shower, no further dressing changes needed- ensure area remains clean and dry until fully healed.   Claris Pong [place] N[place] PA-C 05/15/2020 3:05 PM

## 2020-05-16 LAB — CBC
HCT: 31.3 % — ABNORMAL LOW (ref 39.0–52.0)
Hemoglobin: 9.2 g/dL — ABNORMAL LOW (ref 13.0–17.0)
MCH: 31.2 pg (ref 26.0–34.0)
MCHC: 29.4 g/dL — ABNORMAL LOW (ref 30.0–36.0)
MCV: 106.1 fL — ABNORMAL HIGH (ref 80.0–100.0)
Platelets: 233 10*3/uL (ref 150–400)
RBC: 2.95 MIL/uL — ABNORMAL LOW (ref 4.22–5.81)
RDW: 15.9 % — ABNORMAL HIGH (ref 11.5–15.5)
WBC: 9.3 10*3/uL (ref 4.0–10.5)
nRBC: 0 % (ref 0.0–0.2)

## 2020-05-16 LAB — RENAL FUNCTION PANEL
Albumin: 2.2 g/dL — ABNORMAL LOW (ref 3.5–5.0)
Anion gap: 9 (ref 5–15)
BUN: 35 mg/dL — ABNORMAL HIGH (ref 8–23)
CO2: 24 mmol/L (ref 22–32)
Calcium: 7.5 mg/dL — ABNORMAL LOW (ref 8.9–10.3)
Chloride: 105 mmol/L (ref 98–111)
Creatinine, Ser: 1.78 mg/dL — ABNORMAL HIGH (ref 0.61–1.24)
GFR, Estimated: 43 mL/min — ABNORMAL LOW (ref >60)
Glucose, Bld: 105 mg/dL — ABNORMAL HIGH (ref 70–99)
Phosphorus: 4.2 mg/dL (ref 2.5–4.6)
Potassium: 3.6 mmol/L (ref 3.5–5.1)
Sodium: 138 mmol/L (ref 135–145)

## 2020-05-17 ENCOUNTER — Other Ambulatory Visit (HOSPITAL_COMMUNITY): Payer: Medicare Other

## 2021-11-21 IMAGING — CT CT ABDOMEN W/O CM
2 of 6 series · 14 of 46 positions shown, 16 images · non-contrast
Comparison: Abdominal radiograph dated 04/04/2020.

CLINICAL DATA: 59-year-old male with dysphagia.

EXAM:
CT ABDOMEN WITHOUT CONTRAST
TECHNIQUE: Multidetector CT imaging of the abdomen was performed following the
standard protocol without IV contrast.

[Series 6: ap without · axial · non-contrast · 0.98mm/px · z∈[-168,+67]mm · 11 of 57 slices shown, 13 images]
[im 5/57  soft-tissue]
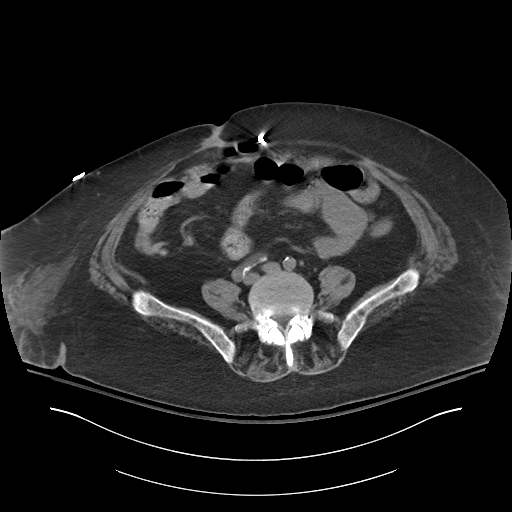
[im 5/57  bone]
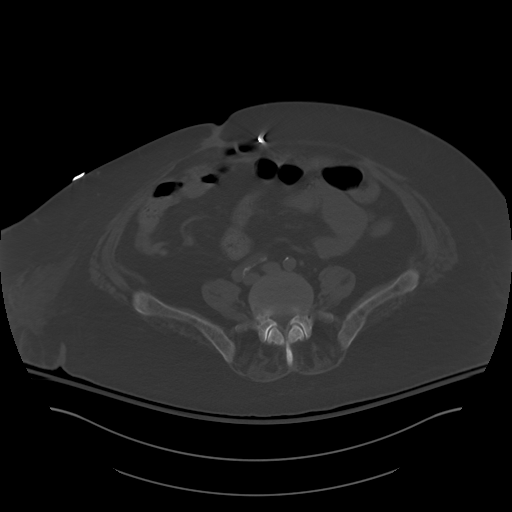
[im 9/57  soft-tissue]
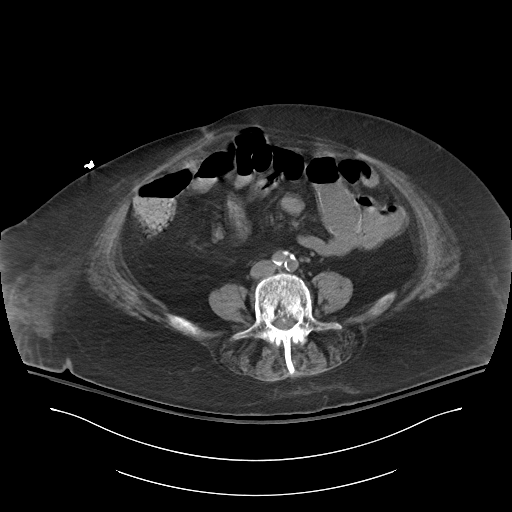
[im 13/57  soft-tissue]
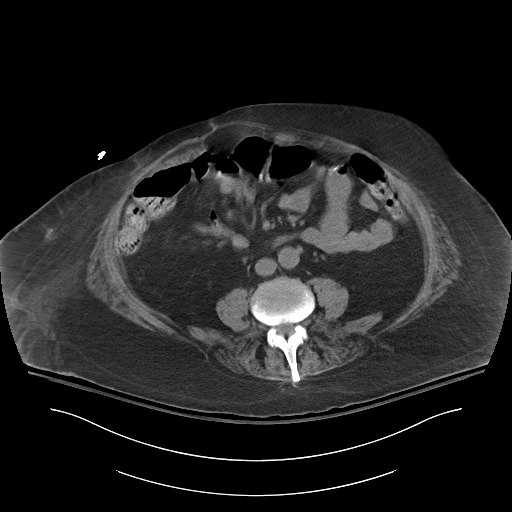
[im 18/57  soft-tissue]
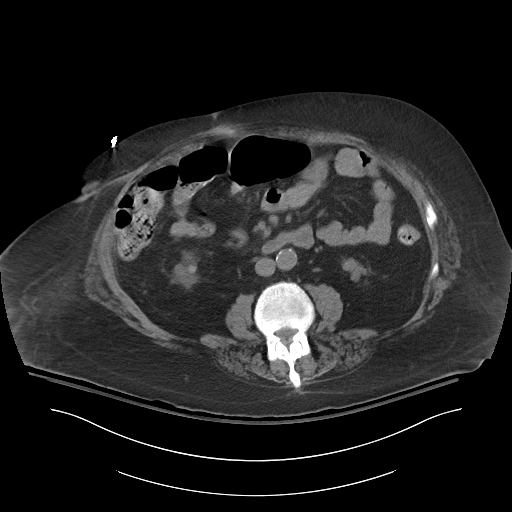
[im 22/57  soft-tissue]
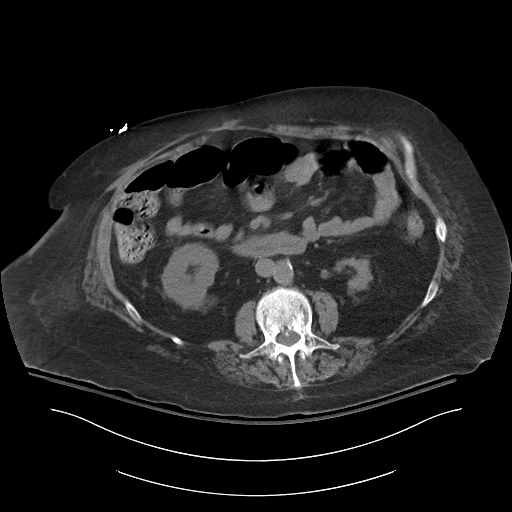
[im 31/57  soft-tissue]
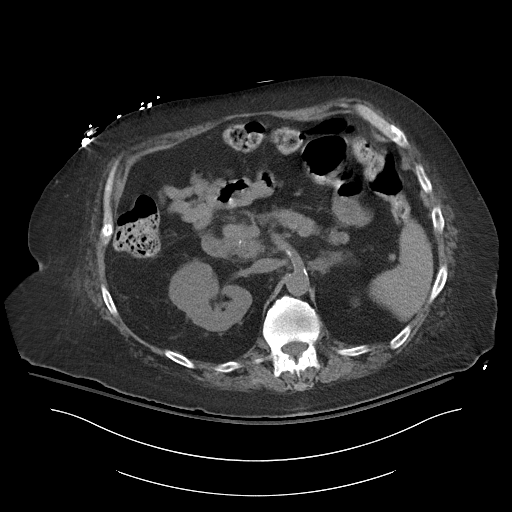
[im 35/57  soft-tissue]
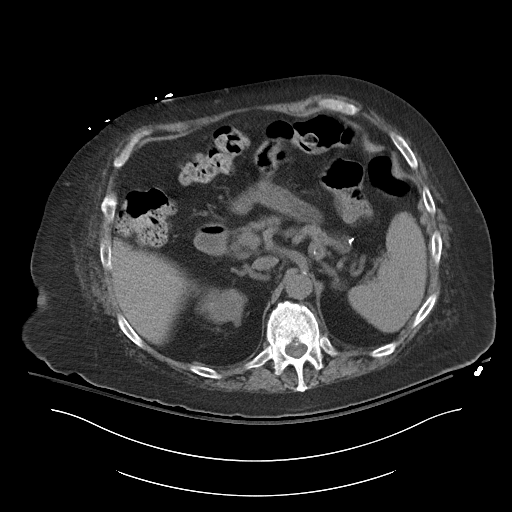
[im 39/57  soft-tissue]
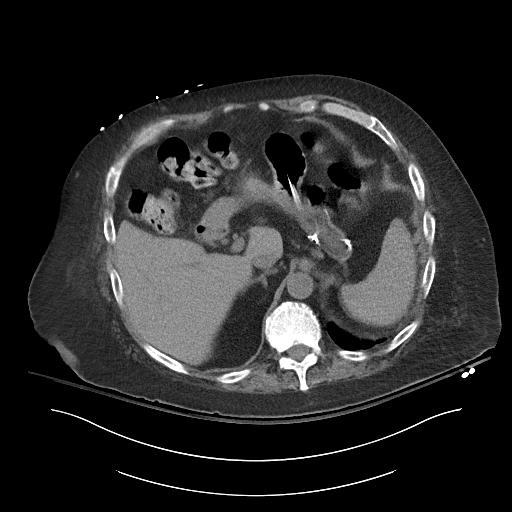
[im 44/57  soft-tissue]
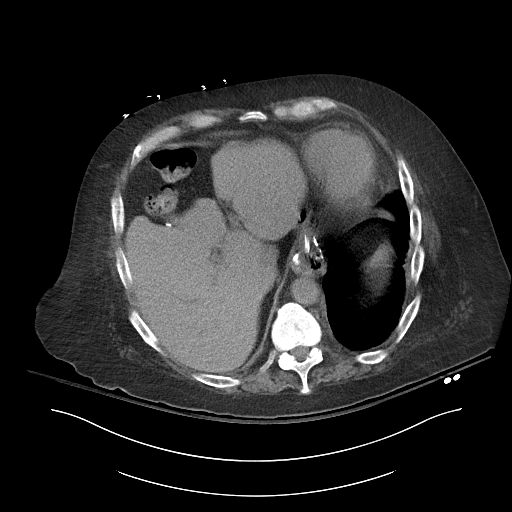
[im 44/57  bone]
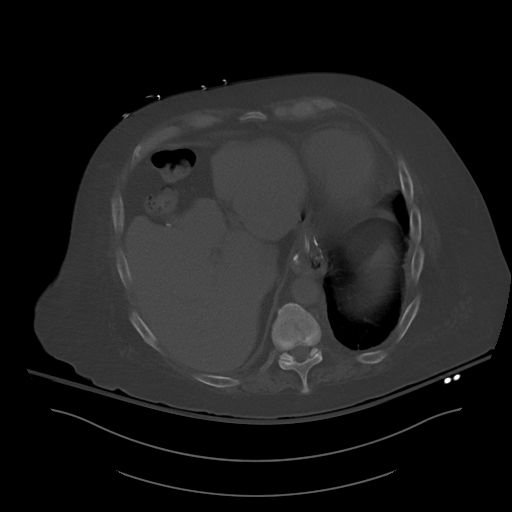
[im 48/57  soft-tissue]
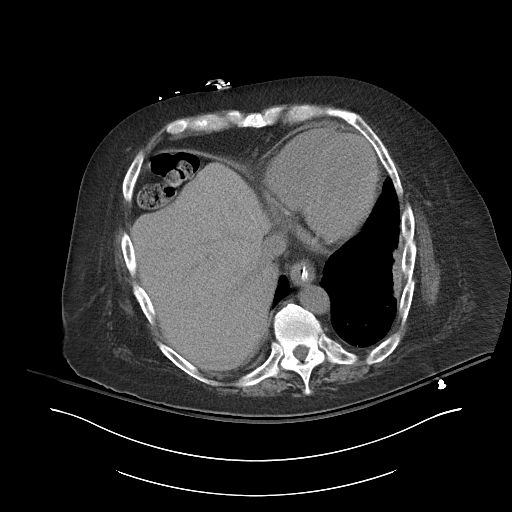
[im 52/57  soft-tissue]
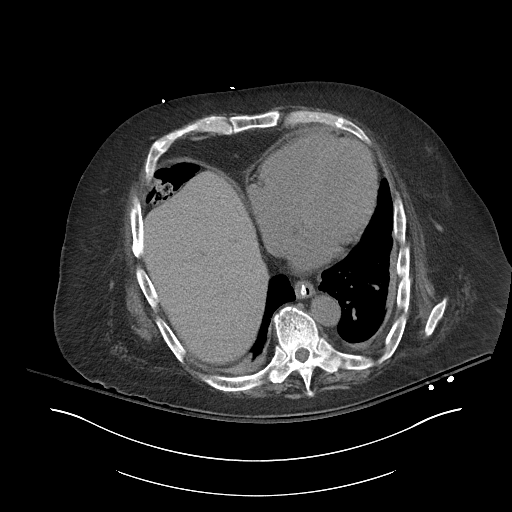

[Series 9: cor · coronal · 0.55mm/px · 3 of 116 slices shown]
[im 39/116  soft-tissue]
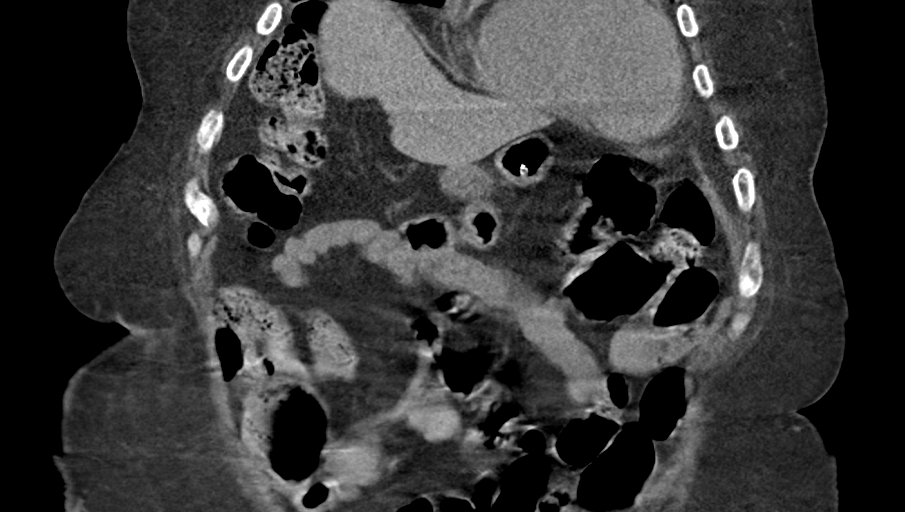
[im 52/116  soft-tissue]
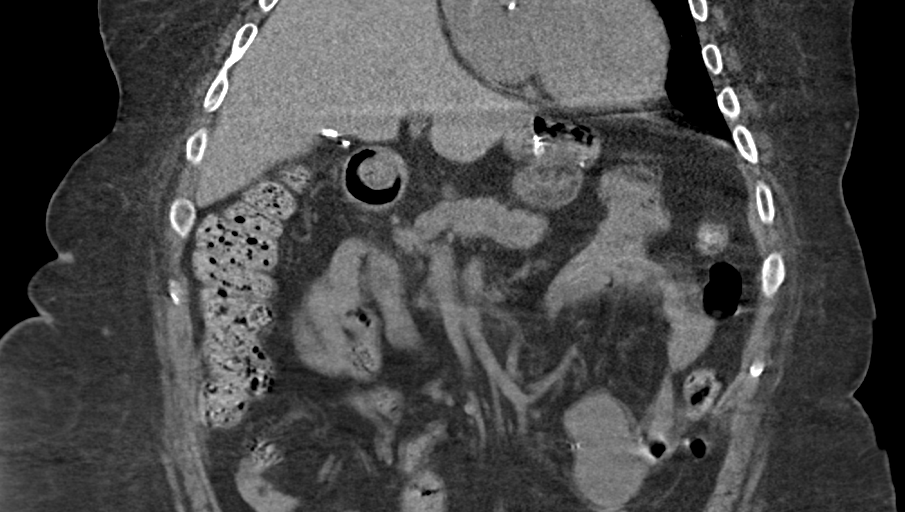
[im 64/116  soft-tissue]
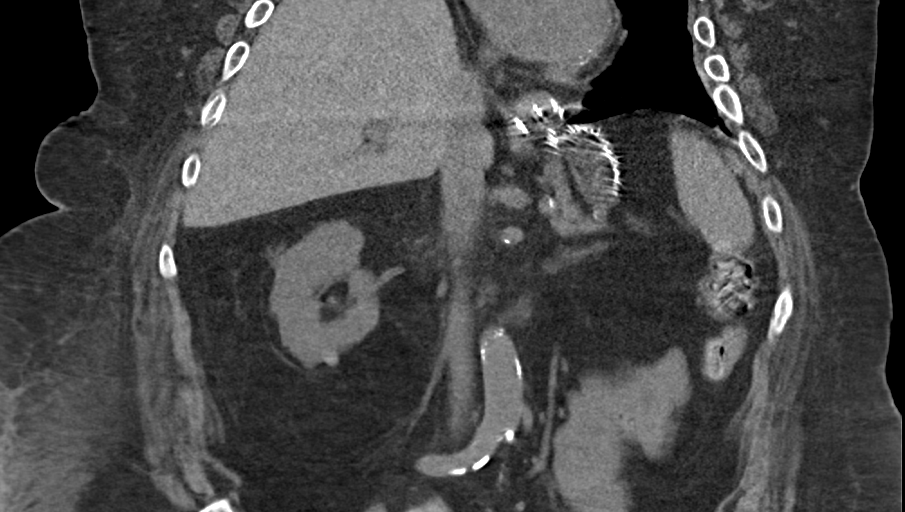

[14 of 46 positions shown; findings below may reference images not displayed]

FINDINGS: Evaluation of this exam is limited in the absence of intravenous
contrast.

Lower chest: Bibasilar linear atelectasis/scarring. Coronary
vascular calcification. Probable chronic trace pericardial effusion.

No intra-abdominal free air or free fluid.

Hepatobiliary: The liver is unremarkable. No intrahepatic biliary
ductal dilatation. Cholecystectomy. No retained calcified stone
noted in the central CBD.

Pancreas: No acute findings. No inflammatory changes.

Spleen: Normal in size without focal abnormality.

Adrenals/Urinary Tract: The adrenal glands unremarkable. Atrophic
left kidney. There is no hydronephrosis or nephrolithiasis on the
right. Subcentimeter bilateral renal hypodense lesions are not
characterized on this CT. The visualized ureters are unremarkable.

Stomach/Bowel: Postsurgical changes of Roux-en-Y gastric bypass.
There is a small hiatal hernia. Enteric tube with tip in the distal
gastric pouch or in the proximal jejunal loops of the Roux limb. No
bowel dilatation. Moderate stool throughout the colon. The appendix
is normal.

Vascular/Lymphatic: Mild aortoiliac atherosclerotic disease. The IVC
is unremarkable. No portal venous gas. There is no adenopathy.

Other: Midline vertical anterior abdominal wall incisional scar.

Musculoskeletal: Diffuse osteosclerosis, likely renal
osteodystrophy. Multilevel chronic compression fractures. No acute
osseous pathology.
IMPRESSION: 1. No acute intra-abdominal pathology.
2. Postsurgical changes of Roux-en-Y gastric bypass. No bowel
obstruction. Normal appendix.
3. Atrophic left kidney with evidence of renal osteodystrophy.
4. Aortic Atherosclerosis ([id number]).

## 2021-11-27 IMAGING — DX DG CHEST 1V PORT
1 series · 1 of 1 positions shown · non-contrast
Comparison: Radiograph 04/18/2020 CT abdomen and pelvis 05/01/2020

CLINICAL DATA: Respiratory failure, history of COPD

EXAM:
PORTABLE CHEST 1 VIEW

[chest]
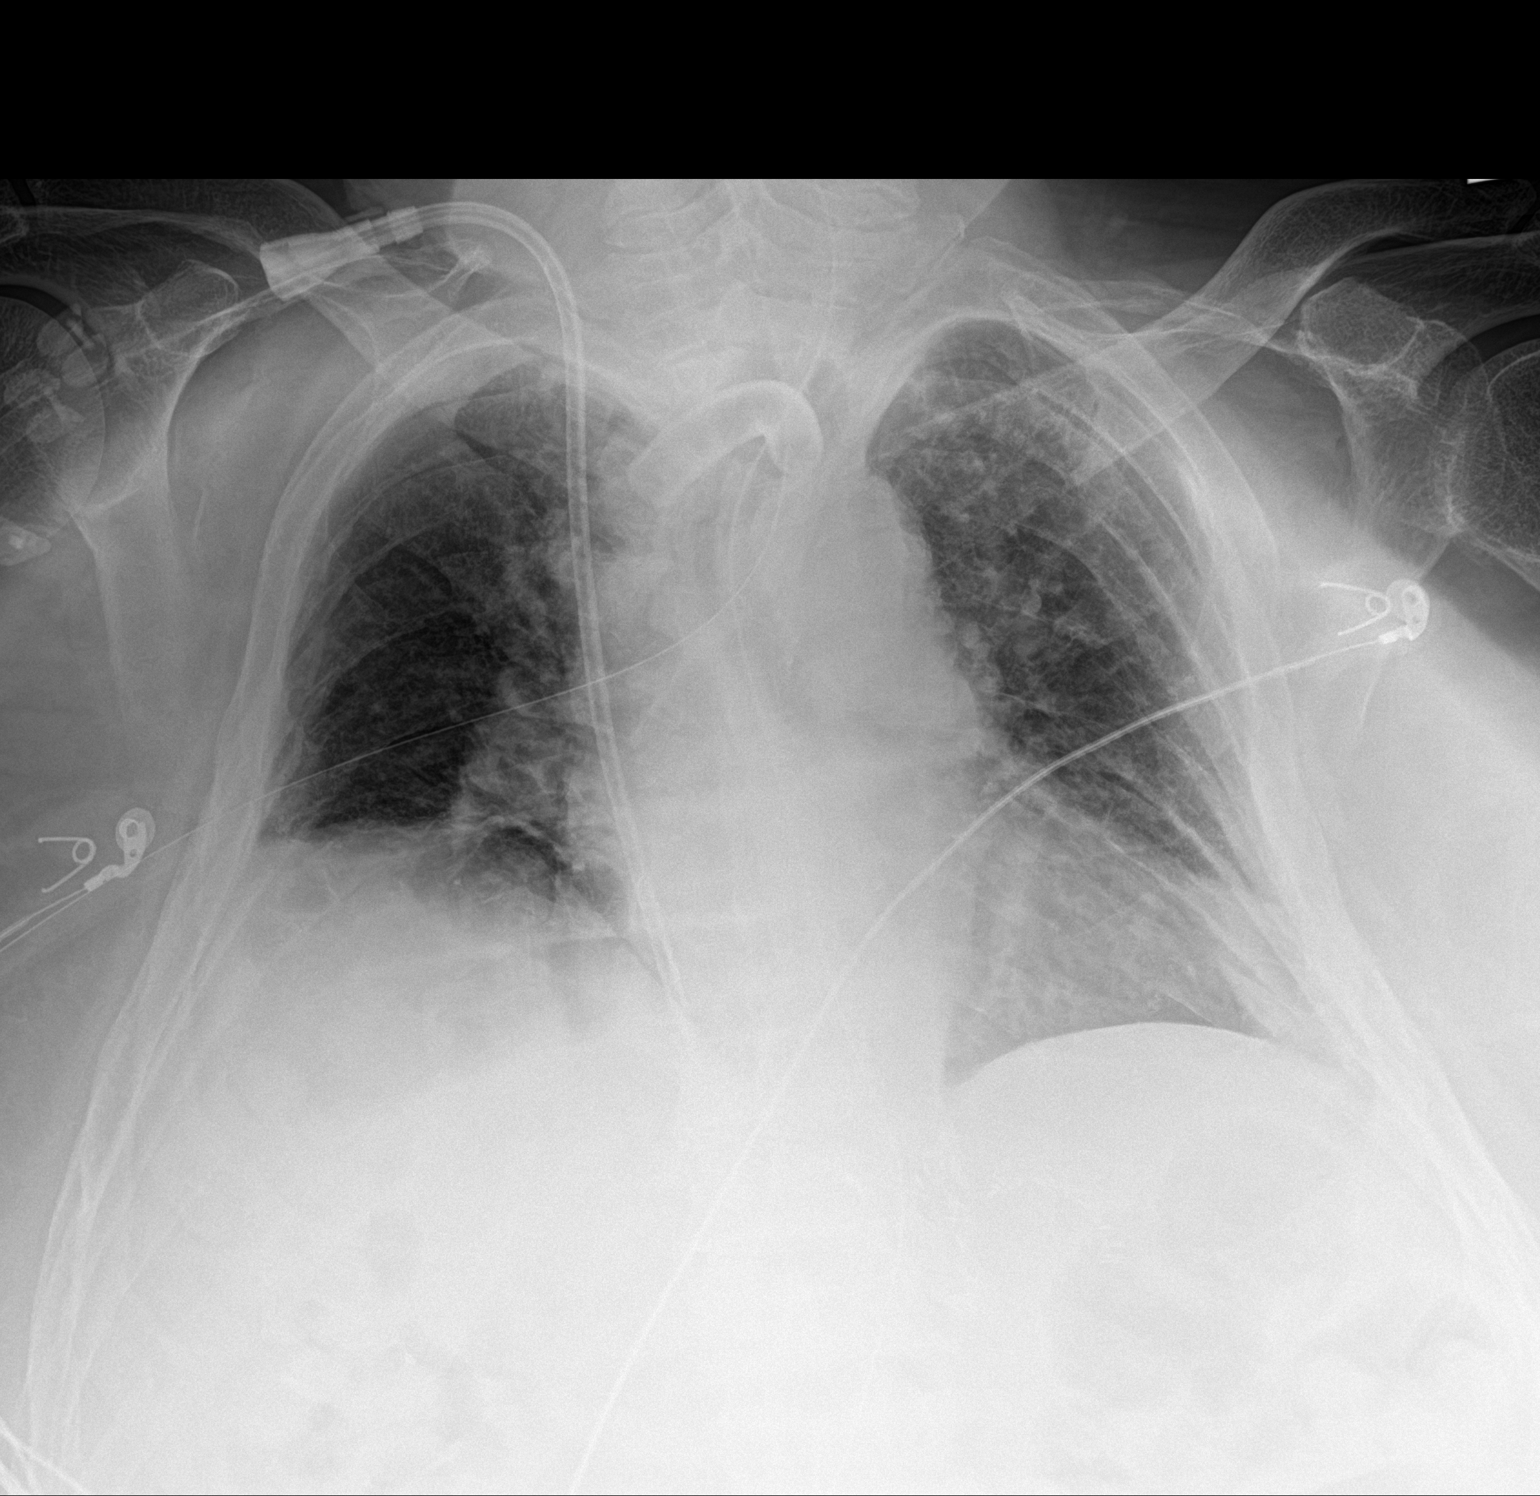

[1 of 1 positions shown; findings below may reference images not displayed]

FINDINGS: Endotracheal tube tip terminates near the mid trachea approximately
4.7 cm from the carina.

Transesophageal tube tip and side port distal to the GE junction.

Dual lumen right IJ approach central venous catheter tip terminates
at the right atrium.

Telemetry leads and external support devices overlie the chest.

Low lung volumes and atelectatic changes with additional scarring
and bandlike opacities throughout both lungs. Chronically coarsened
interstitial changes are also similar to comparison. No new focal
consolidative opacity, pneumothorax or layering effusion. Stable
cardiomediastinal contours with a calcified aorta. No acute osseous
or soft tissue abnormality.
IMPRESSION: Lines and tubes as above.

Stable scarring and chronically coarsened interstitial changes.

Low volumes and atelectasis. Similar appearance to most recent
comparison.
